# Patient Record
Sex: Female | Born: 1955 | Race: White | Hispanic: No | Marital: Married | Smoking: Never smoker
Health system: Southern US, Community
[De-identification: ages and names within clinical notes are randomized; demographics above are authoritative.]

## PROBLEM LIST (undated history)

## (undated) DIAGNOSIS — F419 Anxiety disorder, unspecified: Secondary | ICD-10-CM

## (undated) HISTORY — PX: APPENDECTOMY: SHX54

## (undated) HISTORY — PX: NOSE SURGERY: SHX723

## (undated) HISTORY — DX: Anxiety disorder, unspecified: F41.9

---

## 2013-07-08 LAB — HM COLONOSCOPY

## 2017-09-30 ENCOUNTER — Ambulatory Visit: Payer: Self-pay | Admitting: Family Medicine

## 2017-10-02 DIAGNOSIS — F419 Anxiety disorder, unspecified: Secondary | ICD-10-CM | POA: Diagnosis not present

## 2017-10-20 DIAGNOSIS — E559 Vitamin D deficiency, unspecified: Secondary | ICD-10-CM | POA: Diagnosis not present

## 2017-10-20 DIAGNOSIS — E785 Hyperlipidemia, unspecified: Secondary | ICD-10-CM | POA: Diagnosis not present

## 2017-10-20 DIAGNOSIS — F419 Anxiety disorder, unspecified: Secondary | ICD-10-CM | POA: Diagnosis not present

## 2018-03-02 ENCOUNTER — Other Ambulatory Visit: Payer: Self-pay

## 2018-03-02 ENCOUNTER — Encounter: Payer: Self-pay | Admitting: Family Medicine

## 2018-03-02 ENCOUNTER — Ambulatory Visit (INDEPENDENT_AMBULATORY_CARE_PROVIDER_SITE_OTHER): Payer: 59 | Admitting: Family Medicine

## 2018-03-02 VITALS — BP 126/84 | HR 64 | Temp 97.8°F | Resp 14 | Ht 66.0 in | Wt 157.0 lb

## 2018-03-02 DIAGNOSIS — Z0000 Encounter for general adult medical examination without abnormal findings: Secondary | ICD-10-CM | POA: Diagnosis not present

## 2018-03-02 DIAGNOSIS — R0683 Snoring: Secondary | ICD-10-CM

## 2018-03-02 DIAGNOSIS — F419 Anxiety disorder, unspecified: Secondary | ICD-10-CM | POA: Diagnosis not present

## 2018-03-02 DIAGNOSIS — G4733 Obstructive sleep apnea (adult) (pediatric): Secondary | ICD-10-CM | POA: Insufficient documentation

## 2018-03-02 NOTE — Progress Notes (Signed)
Patient ID: Theresa Matthews, female    DOB: 1956/08/21, 62 y.o.   MRN: [id number]  Chief Complaint  Patient presents with  . Establish Care    Allergies Patient has no allergy information on record.  Subjective:   Theresa Matthews is a 62 y.o. female who presents to [facility] today.  HPI Ms. Harlacher presents today as a new patient visit to establish care.  She is followed by OB/GYN in [place], [place].  She is not sure but considering continuing to see her gynecologist.  She reports that she is fairly healthy.  Does not have any major medical problems.  Does take Paxil 10 mg a day for anxiety.  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  She does not feel depressed.  Does not have any suicidal or homicidal ideations.  No side effects with this medication.  Would like to continue it.  Has had a colonoscopy several years ago and it was within normal limits.  Reports that her energy level is good.  Her mood is good.  Married enjoys being active and hiking.  Denies any chest pain, shortness of breath, swelling in her extremities.  Does use his CPAP machine for heavy snoring.  Does not have sleep disordered breathing or sleep apnea.  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  Is active.  Married with children.   Past Medical History:  Diagnosis Date  . Anxiety     Past Surgical History:  Procedure Laterality Date  . APPENDECTOMY      Family History  Problem Relation Age of Onset  . Hypertension Mother   . Hypothyroidism Mother   . Heart disease Father   . Bladder Cancer Father   . Brain cancer Brother      Social History   Socioeconomic History  . Marital status: Married    Spouse name: Not on file  . Number of children: Not on file  . Years of education: Not on file  . Highest education level: Not on file  Occupational History  . Occupation: Therapist, sports  Social Needs  . Financial resource  strain: Not hard at all  . Food insecurity:    Worry: Never true    Inability: Never true  . Transportation needs:    Medical: No    Non-medical: No  Tobacco Use  . Smoking status: Never Smoker  . Smokeless tobacco: Never Used  Substance and Sexual Activity  . Alcohol use: Not Currently  . Drug use: Never  . Sexual activity: Yes    Partners: Male  Lifestyle  . Physical activity:    Days per week: 4 days    Minutes per session: 60 min  . Stress: Only a little  Relationships  . Social connections:    Talks on phone: More than three times a week    Gets together: Three times a week    Attends religious service: More than 4 times per year    Active member of club or organization: Yes    Attends meetings of clubs or organizations: More than 4 times per year    Relationship status: Married  Other Topics Concern  . Not on file  Social History Narrative   Is a fraternal twin. Grew up in [place].    Married, married for over 30 years.   4 children.    Lives in [place], [place].   Eats all food  groups.   Wear seatbelt.   Exercise, hike trails, ride bikes.    Review of Systems  Constitutional: Negative for activity change, appetite change and fever.  HENT: Negative for dental problem, ear pain, hearing loss, mouth sores, postnasal drip, tinnitus, trouble swallowing and voice change.   Eyes: Negative for visual disturbance.  Respiratory: Negative for cough, chest tightness, shortness of breath, wheezing and stridor.   Cardiovascular: Negative for chest pain, palpitations and leg swelling.  Gastrointestinal: Negative for abdominal pain, anal bleeding, constipation, nausea and vomiting.  Genitourinary: Negative for dyspareunia, dysuria, flank pain, frequency, menstrual problem, pelvic pain, urgency and vaginal pain.  Musculoskeletal: Negative for arthralgias, back pain, gait problem, joint swelling and neck stiffness.  Skin: Negative for rash.  Neurological: Negative for  dizziness, tremors, seizures, syncope, weakness, light-headedness and headaches.  Hematological: Negative for adenopathy. Does not bruise/bleed easily.  Psychiatric/Behavioral: Negative for behavioral problems, decreased concentration, dysphoric mood, self-injury, sleep disturbance and suicidal ideas. The patient is not nervous/anxious.     Current Outpatient Medications on File Prior to Visit  Medication Sig Dispense Refill  . PARoxetine (PAXIL) 10 MG tablet Take 10 mg by mouth daily.  1   No current facility-administered medications on file prior to visit.     Objective:   BP 126/84 (BP Location: Left Arm, Patient Position: Sitting, Cuff Size: Normal)   Pulse 64   Temp 97.8 F (36.6 C) (Oral)   Resp 14   Ht 5\' 6"  (1.676 m)   Wt 157 lb (71.2 kg)   SpO2 98%   BMI 25.34 kg/m   Physical Exam  Constitutional: She is oriented to person, place, and time. She appears well-developed and well-nourished. No distress.  HENT:  Head: Normocephalic and atraumatic.  Right Ear: External ear normal.  Left Ear: External ear normal.  Nose: Nose normal.  Mouth/Throat: Oropharynx is clear and moist. No oropharyngeal exudate.  Eyes: Pupils are equal, round, and reactive to light. Conjunctivae and EOM are normal. No scleral icterus.  Neck: Normal range of motion. Neck supple. No JVD present. No tracheal deviation present. No thyromegaly present.  Cardiovascular: Normal rate, regular rhythm, normal heart sounds and intact distal pulses.  No murmur heard. Pulmonary/Chest: Effort normal and breath sounds normal. No stridor. No respiratory distress. She has no wheezes. She has no rales. She exhibits no tenderness.  Abdominal: Soft. Bowel sounds are normal. She exhibits no distension and no mass. There is no tenderness. There is no rebound. No hernia.  Musculoskeletal: Normal range of motion.  Lymphadenopathy:    She has no cervical adenopathy.  Neurological: She is alert and oriented to person,  place, and time. She displays normal reflexes. No cranial nerve deficit or sensory deficit. She exhibits normal muscle tone. Coordination normal.  Skin: Skin is warm and dry. Capillary refill takes less than 2 seconds. No rash noted.  Psychiatric: She has a normal mood and affect. Her behavior is normal. Judgment and thought content normal.  Nursing note and vitals reviewed.    Assessment and Plan  1. Well adult exam Age appropriate anticipatory guidance and discussed with patient.   Labs ordered.  - HIV antibody - Hepatitis C antibody - MM Digital Screening; Future - Lipid panel - CBC with Differential/Platelet - COMPLETE METABOLIC PANEL WITH GFR Vision  Dentist Colonoscopy UTD. Bone density, age 49 years. Follow up with gynecology as directed. Need records from Employee Health/immunization/labs for immunity to Hepatitis B Request records for colonoscopy from [facility]  Did discuss with  patient that her BMI is borderline being overweight.  Diet, exercise and healthy lifestyle modifications recommended.  We will request records to make sure that she has had immunity status testing for hepatitis B did her job risks. Did recommend that she either follow-up for her schedule with gynecology for Pap, pelvic, and clinical breast exam. Calcium 600 mg twice a day Vitamin D 2000 IU qd.  2. Anxiety Stable. Patient counseled in detail regarding the risks of medication. Told to call or return to clinic if develop any worrisome signs or symptoms. Patient voiced understanding.  Call with concerns. Refill when needed.  Suicide risks evaluated and documented in note if present or in the area below.  Patient understands the treatment plan and is in agreement. Agrees to keep follow up and call prior or return to clinic if needed.    3. Snoring, using CPAP Continue current plan. Recommend repeat NPSNG in 3-4 years.    Return in about 6 months (around 09/02/2018), or if symptoms worsen or  fail to improve. Caren Macadam, MD 03/02/2018

## 2018-03-02 NOTE — Patient Instructions (Addendum)
Need records from Employee Health/immunization/labs for immunity to Hepatitis B Request records for colonoscopy from [facility]  Calcium 600 mg twice a day Vitamin D 2000 IU a day

## 2018-03-03 LAB — CBC WITH DIFFERENTIAL/PLATELET
BASOS PCT: 1 %
Basophils Absolute: 48 cells/uL (ref 0–200)
Eosinophils Absolute: 67 cells/uL (ref 15–500)
Eosinophils Relative: 1.4 %
HCT: 40.6 % (ref 35.0–45.0)
Hemoglobin: 13.8 g/dL (ref 11.7–15.5)
Lymphs Abs: 802 cells/uL — ABNORMAL LOW (ref 850–3900)
MCH: 30.5 pg (ref 27.0–33.0)
MCHC: 34 g/dL (ref 32.0–36.0)
MCV: 89.6 fL (ref 80.0–100.0)
MONOS PCT: 9.1 %
MPV: 10.3 fL (ref 7.5–12.5)
NEUTROS ABS: 3446 {cells}/uL (ref 1500–7800)
Neutrophils Relative %: 71.8 %
PLATELETS: 258 10*3/uL (ref 140–400)
RBC: 4.53 10*6/uL (ref 3.80–5.10)
RDW: 12.1 % (ref 11.0–15.0)
Total Lymphocyte: 16.7 %
WBC mixed population: 437 cells/uL (ref 200–950)
WBC: 4.8 10*3/uL (ref 3.8–10.8)

## 2018-03-03 LAB — COMPLETE METABOLIC PANEL WITH GFR
AG Ratio: 1.8 (calc) (ref 1.0–2.5)
ALT: 12 U/L (ref 6–29)
AST: 18 U/L (ref 10–35)
Albumin: 4.2 g/dL (ref 3.6–5.1)
Alkaline phosphatase (APISO): 54 U/L (ref 33–130)
BILIRUBIN TOTAL: 0.6 mg/dL (ref 0.2–1.2)
BUN: 13 mg/dL (ref 7–25)
CALCIUM: 9.1 mg/dL (ref 8.6–10.4)
CHLORIDE: 106 mmol/L (ref 98–110)
CO2: 29 mmol/L (ref 20–32)
Creat: 0.8 mg/dL (ref 0.50–0.99)
GFR, Est African American: 91 mL/min/{1.73_m2} (ref >=60)
GFR, Est Non African American: 78 mL/min/{1.73_m2} (ref >=60)
GLUCOSE: 88 mg/dL (ref 65–99)
Globulin: 2.4 g/dL (calc) (ref 1.9–3.7)
Potassium: 3.7 mmol/L (ref 3.5–5.3)
Sodium: 141 mmol/L (ref 135–146)
TOTAL PROTEIN: 6.6 g/dL (ref 6.1–8.1)

## 2018-03-03 LAB — LIPID PANEL
CHOL/HDL RATIO: 3 (calc) (ref <5.0)
CHOLESTEROL: 225 mg/dL — AB (ref <200)
HDL: 74 mg/dL (ref >50)
LDL CHOLESTEROL (CALC): 136 mg/dL — AB
Non-HDL Cholesterol (Calc): 151 mg/dL (calc) — ABNORMAL HIGH (ref <130)
Triglycerides: 49 mg/dL (ref <150)

## 2018-03-03 LAB — HIV ANTIBODY (ROUTINE TESTING W REFLEX): HIV 1&2 Ab, 4th Generation: NONREACTIVE

## 2018-03-03 LAB — HEPATITIS C ANTIBODY
Hepatitis C Ab: NONREACTIVE
SIGNAL TO CUT-OFF: 0.01 (ref <1.00)

## 2018-03-04 ENCOUNTER — Encounter: Payer: Self-pay | Admitting: Family Medicine

## 2018-03-26 ENCOUNTER — Encounter: Payer: Self-pay | Admitting: Family Medicine

## 2018-03-27 ENCOUNTER — Encounter: Payer: Self-pay | Admitting: Family Medicine

## 2018-04-30 ENCOUNTER — Telehealth: Payer: Self-pay | Admitting: Family Medicine

## 2018-04-30 ENCOUNTER — Other Ambulatory Visit: Payer: Self-pay

## 2018-04-30 MED ORDER — PAROXETINE HCL 10 MG PO TABS: 10.0000 mg | ORAL_TABLET | Freq: Every day | ORAL | 0 refills | Status: DC

## 2018-04-30 NOTE — Telephone Encounter (Signed)
Patient left message on phone to request the following medication.   PARoxetine (PAXIL) 10 MG tablet  Pharmacy is [facility] in [place], [place]. CB# [phone]

## 2018-04-30 NOTE — Telephone Encounter (Signed)
Paxil refilled with #90 per Dr.Hagler

## 2018-04-30 NOTE — Telephone Encounter (Signed)
Please check patient's med list and confirm dosing, please give her a refill for #90.

## 2018-06-02 ENCOUNTER — Telehealth: Payer: Self-pay | Admitting: Family Medicine

## 2018-06-02 NOTE — Telephone Encounter (Signed)
Spoke with patient and she is wanting to know if she can have enough refills on her Paxil to last through the rest of the year.

## 2018-06-02 NOTE — Telephone Encounter (Signed)
Pt is calling wanting to know if she can get a refill to last thru the year.

## 2018-06-03 ENCOUNTER — Other Ambulatory Visit: Payer: Self-pay

## 2018-06-03 MED ORDER — PAROXETINE HCL 10 MG PO TABS: 10.0000 mg | ORAL_TABLET | Freq: Every day | ORAL | 1 refills | Status: DC

## 2018-06-03 NOTE — Telephone Encounter (Signed)
Yes. Refill paxil 10 mg, 1 po qd #90, 1 refill. Gwen Her. Mannie Stabile, MD

## 2018-06-03 NOTE — Telephone Encounter (Signed)
Medication refilled per Dr.Hagler's instructions

## 2018-06-05 DIAGNOSIS — D485 Neoplasm of uncertain behavior of skin: Secondary | ICD-10-CM | POA: Diagnosis not present

## 2018-06-05 DIAGNOSIS — L298 Other pruritus: Secondary | ICD-10-CM | POA: Diagnosis not present

## 2018-06-05 DIAGNOSIS — L570 Actinic keratosis: Secondary | ICD-10-CM | POA: Diagnosis not present

## 2018-06-05 DIAGNOSIS — Z789 Other specified health status: Secondary | ICD-10-CM | POA: Diagnosis not present

## 2018-06-05 DIAGNOSIS — L820 Inflamed seborrheic keratosis: Secondary | ICD-10-CM | POA: Diagnosis not present

## 2018-06-05 DIAGNOSIS — L538 Other specified erythematous conditions: Secondary | ICD-10-CM | POA: Diagnosis not present

## 2018-07-09 DIAGNOSIS — F410 Panic disorder [episodic paroxysmal anxiety] without agoraphobia: Secondary | ICD-10-CM | POA: Diagnosis not present

## 2018-07-28 DIAGNOSIS — F410 Panic disorder [episodic paroxysmal anxiety] without agoraphobia: Secondary | ICD-10-CM | POA: Diagnosis not present

## 2018-09-21 DIAGNOSIS — Z760 Encounter for issue of repeat prescription: Secondary | ICD-10-CM | POA: Diagnosis not present

## 2018-09-21 DIAGNOSIS — F419 Anxiety disorder, unspecified: Secondary | ICD-10-CM | POA: Diagnosis not present

## 2019-01-12 DIAGNOSIS — Z0000 Encounter for general adult medical examination without abnormal findings: Secondary | ICD-10-CM | POA: Diagnosis not present

## 2019-01-12 DIAGNOSIS — F419 Anxiety disorder, unspecified: Secondary | ICD-10-CM | POA: Diagnosis not present

## 2019-01-12 DIAGNOSIS — E559 Vitamin D deficiency, unspecified: Secondary | ICD-10-CM | POA: Diagnosis not present

## 2019-01-12 DIAGNOSIS — E785 Hyperlipidemia, unspecified: Secondary | ICD-10-CM | POA: Diagnosis not present

## 2019-09-30 DIAGNOSIS — H43813 Vitreous degeneration, bilateral: Secondary | ICD-10-CM | POA: Diagnosis not present

## 2019-09-30 DIAGNOSIS — H40013 Open angle with borderline findings, low risk, bilateral: Secondary | ICD-10-CM | POA: Diagnosis not present

## 2019-09-30 DIAGNOSIS — H04123 Dry eye syndrome of bilateral lacrimal glands: Secondary | ICD-10-CM | POA: Diagnosis not present

## 2019-09-30 DIAGNOSIS — H25813 Combined forms of age-related cataract, bilateral: Secondary | ICD-10-CM | POA: Diagnosis not present

## 2019-10-08 DIAGNOSIS — Z20828 Contact with and (suspected) exposure to other viral communicable diseases: Secondary | ICD-10-CM | POA: Diagnosis not present

## 2019-10-11 DIAGNOSIS — Z03818 Encounter for observation for suspected exposure to other biological agents ruled out: Secondary | ICD-10-CM | POA: Diagnosis not present

## 2019-10-16 DIAGNOSIS — Z20828 Contact with and (suspected) exposure to other viral communicable diseases: Secondary | ICD-10-CM | POA: Diagnosis not present

## 2019-11-08 DIAGNOSIS — Z1231 Encounter for screening mammogram for malignant neoplasm of breast: Secondary | ICD-10-CM | POA: Diagnosis not present

## 2019-11-08 DIAGNOSIS — Z1212 Encounter for screening for malignant neoplasm of rectum: Secondary | ICD-10-CM | POA: Diagnosis not present

## 2019-11-08 DIAGNOSIS — Z01419 Encounter for gynecological examination (general) (routine) without abnormal findings: Secondary | ICD-10-CM | POA: Diagnosis not present

## 2020-01-13 DIAGNOSIS — E559 Vitamin D deficiency, unspecified: Secondary | ICD-10-CM | POA: Diagnosis not present

## 2020-01-13 DIAGNOSIS — Z0000 Encounter for general adult medical examination without abnormal findings: Secondary | ICD-10-CM | POA: Diagnosis not present

## 2020-01-13 DIAGNOSIS — E785 Hyperlipidemia, unspecified: Secondary | ICD-10-CM | POA: Diagnosis not present

## 2020-01-13 DIAGNOSIS — I8391 Asymptomatic varicose veins of right lower extremity: Secondary | ICD-10-CM | POA: Diagnosis not present

## 2020-04-20 ENCOUNTER — Ambulatory Visit: Payer: 59

## 2020-04-20 ENCOUNTER — Other Ambulatory Visit: Payer: Self-pay

## 2020-04-20 ENCOUNTER — Ambulatory Visit: Payer: 59 | Admitting: Orthopaedic Surgery

## 2020-04-20 ENCOUNTER — Encounter: Payer: Self-pay | Admitting: Orthopaedic Surgery

## 2020-04-20 VITALS — BP 128/83 | HR 61 | Ht 66.0 in | Wt 149.0 lb

## 2020-04-20 DIAGNOSIS — M79662 Pain in left lower leg: Secondary | ICD-10-CM

## 2020-04-20 NOTE — Progress Notes (Signed)
Subjective:    Patient ID: Theresa Matthews, female    DOB: July 05, 1956, 64 y.o.   MRN: [id number]  HPI She was helping her husband at their place in [place] and cutting logs and trees on the hill side several days ago.  She fell and twisted and hurt her lower left leg.  She has pain at times now and then.  Other times it does not hurt.  It awakened her with marked pain last night. She called for appointment and we had her come in.  She has no redness, no swelling.  She can walk with tenderness. She has no swelling of the left ankle, no numbness.   Review of Systems  Constitutional: Positive for activity change.  Musculoskeletal: Positive for gait problem.  All other systems reviewed and are negative.  For Review of Systems, all other systems reviewed and are negative.  The following is a summary of the past history medically, past history surgically, known current medicines, social history and family history.  This information is gathered electronically by the computer from prior information and documentation.  I review this each visit and have found including this information at this point in the chart is beneficial and informative.   Past Medical History:  Diagnosis Date   Anxiety     Past Surgical History:  Procedure Laterality Date   APPENDECTOMY      Current Outpatient Medications on File Prior to Visit  Medication Sig Dispense Refill   PARoxetine (PAXIL) 10 MG tablet Take 1 tablet (10 mg total) by mouth daily. 90 tablet 1   No current facility-administered medications on file prior to visit.    Social History   Socioeconomic History   Marital status: Married    Spouse name: Not on file   Number of children: Not on file   Years of education: Not on file   Highest education level: Not on file  Occupational History   Occupation: RN  Tobacco Use   Smoking status: Never Smoker   Smokeless tobacco: Never Used  Scientific laboratory technician Use: Never used    Substance and Sexual Activity   Alcohol use: Not Currently   Drug use: Never   Sexual activity: Yes    Partners: Male  Other Topics Concern   Not on file  Social History Narrative   Is a fraternal twin. Grew up in [place].    Married, married for over 63 years.   4 children.    Lives in [place], [place].   Eats all food groups.   Wear seatbelt.   Exercise, hike trails, ride bikes.   Social Determinants of Health   Financial Resource Strain:    Difficulty of Paying Living Expenses:   Food Insecurity:    Worried About Charity fundraiser in the Last Year:    Arboriculturist in the Last Year:   Transportation Needs:    Film/video editor (Medical):    Lack of Transportation (Non-Medical):   Physical Activity:    Days of Exercise per Week:    Minutes of Exercise per Session:   Stress:    Feeling of Stress :   Social Connections:    Frequency of Communication with Friends and Family:    Frequency of Social Gatherings with Friends and Family:    Attends Religious Services:    Active Member of Clubs or Organizations:    Attends Archivist Meetings:    Marital Status:   Intimate  Partner Violence:    Fear of Current or Ex-Partner:    Emotionally Abused:    Physically Abused:    Sexually Abused:     Family History  Problem Relation Age of Onset   Hypertension Mother    Hypothyroidism Mother    Heart disease Father    Bladder Cancer Father    Brain cancer Brother     BP 128/83    Pulse 61    Ht 5\' 6"  (1.676 m)    Wt 149 lb (67.6 kg)    BMI 24.05 kg/m   Body mass index is 24.05 kg/m.     Objective:   Physical Exam Vitals and nursing note reviewed.  Constitutional:      Appearance: She is well-developed.  HENT:     Head: Normocephalic and atraumatic.  Eyes:     Conjunctiva/sclera: Conjunctivae normal.     Pupils: Pupils are equal, round, and reactive to light.  Cardiovascular:     Rate and Rhythm: Normal rate and  regular rhythm.  Pulmonary:     Effort: Pulmonary effort is normal.  Abdominal:     Palpations: Abdomen is soft.  Musculoskeletal:     Cervical back: Normal range of motion and neck supple.       Legs:  Skin:    General: Skin is warm and dry.  Neurological:     Mental Status: She is alert and oriented to person, place, and time.     Cranial Nerves: No cranial nerve deficit.     Motor: No abnormal muscle tone.     Coordination: Coordination normal.     Deep Tendon Reflexes: Reflexes are normal and symmetric. Reflexes normal.  Psychiatric:        Behavior: Behavior normal.        Thought Content: Thought content normal.        Judgment: Judgment normal.    X-rays were done of the left lower leg, reported separately.       Assessment & Plan:   Encounter Diagnosis  Name Primary?   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  She can work if she chooses.  I will give CAM walker.   Use ice, Aspercreme, biofreeze or Voltaren Gel.  Return in two weeks.  Call if any problem.  Precautions discussed.   Electronically Signed Sanjuana Kava, MD 7/1/202111:03 AM

## 2020-05-02 ENCOUNTER — Ambulatory Visit: Payer: 59 | Admitting: Orthopaedic Surgery

## 2020-08-24 DIAGNOSIS — H5203 Hypermetropia, bilateral: Secondary | ICD-10-CM | POA: Diagnosis not present

## 2020-08-24 DIAGNOSIS — H2513 Age-related nuclear cataract, bilateral: Secondary | ICD-10-CM | POA: Diagnosis not present

## 2020-08-24 DIAGNOSIS — H43811 Vitreous degeneration, right eye: Secondary | ICD-10-CM | POA: Diagnosis not present

## 2020-08-24 DIAGNOSIS — H40013 Open angle with borderline findings, low risk, bilateral: Secondary | ICD-10-CM | POA: Diagnosis not present

## 2020-12-10 ENCOUNTER — Emergency Department (HOSPITAL_COMMUNITY): Payer: Medicare Other

## 2020-12-10 ENCOUNTER — Observation Stay (HOSPITAL_COMMUNITY)
Admission: EM | Admit: 2020-12-10 | Discharge: 2020-12-11 | Disposition: A | Discharge status: Home / Self Care | Payer: Medicare Other | Attending: Internal Medicine | Admitting: Internal Medicine

## 2020-12-10 ENCOUNTER — Other Ambulatory Visit: Payer: Self-pay

## 2020-12-10 ENCOUNTER — Encounter (HOSPITAL_COMMUNITY): Payer: Self-pay | Admitting: Emergency Medicine

## 2020-12-10 DIAGNOSIS — E785 Hyperlipidemia, unspecified: Secondary | ICD-10-CM | POA: Diagnosis present

## 2020-12-10 DIAGNOSIS — Z79899 Other long term (current) drug therapy: Secondary | ICD-10-CM | POA: Insufficient documentation

## 2020-12-10 DIAGNOSIS — R030 Elevated blood-pressure reading, without diagnosis of hypertension: Secondary | ICD-10-CM | POA: Diagnosis present

## 2020-12-10 DIAGNOSIS — Z20822 Contact with and (suspected) exposure to covid-19: Secondary | ICD-10-CM | POA: Diagnosis not present

## 2020-12-10 DIAGNOSIS — R079 Chest pain, unspecified: Principal | ICD-10-CM | POA: Insufficient documentation

## 2020-12-10 DIAGNOSIS — R918 Other nonspecific abnormal finding of lung field: Secondary | ICD-10-CM | POA: Diagnosis not present

## 2020-12-10 DIAGNOSIS — F419 Anxiety disorder, unspecified: Secondary | ICD-10-CM | POA: Diagnosis present

## 2020-12-10 DIAGNOSIS — I10 Essential (primary) hypertension: Secondary | ICD-10-CM | POA: Diagnosis not present

## 2020-12-10 HISTORY — DX: Chest pain, unspecified: R07.9

## 2020-12-10 LAB — HEPATIC FUNCTION PANEL
ALT: 15 U/L (ref 0–44)
AST: 18 U/L (ref 15–41)
Albumin: 4 g/dL (ref 3.5–5.0)
Alkaline Phosphatase: 45 U/L (ref 38–126)
Bilirubin, Direct: <0.1 mg/dL (ref 0.0–0.2)
Total Bilirubin: 0.5 mg/dL (ref 0.3–1.2)
Total Protein: 6.6 g/dL (ref 6.5–8.1)

## 2020-12-10 LAB — BASIC METABOLIC PANEL
Anion gap: 7 (ref 5–15)
BUN: 11 mg/dL (ref 8–23)
CO2: 29 mmol/L (ref 22–32)
Calcium: 9.5 mg/dL (ref 8.9–10.3)
Chloride: 104 mmol/L (ref 98–111)
Creatinine, Ser: 0.73 mg/dL (ref 0.44–1.00)
GFR, Estimated: >60 mL/min (ref >60)
Glucose, Bld: 99 mg/dL (ref 70–99)
Potassium: 3.9 mmol/L (ref 3.5–5.1)
Sodium: 140 mmol/L (ref 135–145)

## 2020-12-10 LAB — CBC
HCT: 46.5 % — ABNORMAL HIGH (ref 36.0–46.0)
Hemoglobin: 15.5 g/dL — ABNORMAL HIGH (ref 12.0–15.0)
MCH: 31.7 pg (ref 26.0–34.0)
MCHC: 33.3 g/dL (ref 30.0–36.0)
MCV: 95.1 fL (ref 80.0–100.0)
Platelets: 288 10*3/uL (ref 150–400)
RBC: 4.89 MIL/uL (ref 3.87–5.11)
RDW: 12.4 % (ref 11.5–15.5)
WBC: 5.2 10*3/uL (ref 4.0–10.5)
nRBC: 0 % (ref 0.0–0.2)

## 2020-12-10 LAB — RESP PANEL BY RT-PCR (FLU A&B, COVID) ARPGX2
Influenza A by PCR: NEGATIVE
Influenza B by PCR: NEGATIVE
SARS Coronavirus 2 by RT PCR: NEGATIVE

## 2020-12-10 LAB — TROPONIN I (HIGH SENSITIVITY)
Troponin I (High Sensitivity): 4 ng/L (ref <18)
Troponin I (High Sensitivity): 4 ng/L (ref <18)

## 2020-12-10 MED ORDER — ENOXAPARIN SODIUM 40 MG/0.4ML ~~LOC~~ SOLN: 40.0000 mg | SUBCUTANEOUS | Status: DC

## 2020-12-10 MED ORDER — ASPIRIN EC 81 MG PO TBEC
81.0000 mg | DELAYED_RELEASE_TABLET | Freq: Every day | ORAL | Status: DC
  Administered 2020-12-11: 81 mg via ORAL
  Filled 2020-12-10: qty 1

## 2020-12-10 MED ORDER — ACETAMINOPHEN 325 MG PO TABS: 650.0000 mg | ORAL_TABLET | Freq: Four times a day (QID) | ORAL | Status: DC | PRN

## 2020-12-10 MED ORDER — ACETAMINOPHEN 650 MG RE SUPP: 650.0000 mg | Freq: Four times a day (QID) | RECTAL | Status: DC | PRN

## 2020-12-10 MED ORDER — IOHEXOL 300 MG/ML  SOLN
75.0000 mL | Freq: Once | INTRAMUSCULAR | Status: AC | PRN
  Administered 2020-12-10: 75 mL via INTRAVENOUS

## 2020-12-10 MED ORDER — SODIUM CHLORIDE 0.9 % IV SOLN: 75.0000 mL/h | INTRAVENOUS | Status: AC

## 2020-12-10 MED ORDER — HYDROCODONE-ACETAMINOPHEN 5-325 MG PO TABS: 1.0000 | ORAL_TABLET | ORAL | Status: DC | PRN

## 2020-12-10 MED ORDER — PAROXETINE HCL 10 MG PO TABS
10.0000 mg | ORAL_TABLET | Freq: Every day | ORAL | Status: DC
  Administered 2020-12-10: 10 mg via ORAL
  Filled 2020-12-10 (×3): qty 1

## 2020-12-10 NOTE — H&P (Signed)
Poet Hineman URK:[id number] DOB: 1956/07/29 DOA: 12/10/2020    PCP: Dairl Ponder, MD   Outpatient Specialists:   NONE    Patient arrived to ER on 12/10/20 at 1028 Referred by Attending Davonna Belling, MD   Patient coming from: home Lives   With family    Chief Complaint:  Chief Complaint  Patient presents with  . Chest Pain    HPI: Theresa Matthews is a 65 y.o. female with medical history significant of HLD, anxiety    Presented with   Squeezing chest pain started with Am while going to church at 9:17 AM Radiated to jaw bilateral lasted 20 min  No SOB, no N/V Patient is active at baseline, takes stairs when walks with no exertional CP She have had 5 episodes like that before Not associated with eating No pleuritic pain Of note Patient daughter is getting married in 2 days  Father had CABG in 70's  No tobacco or EtOh   Infectious risk factors:  Reports  chest pain,   Has   been vaccinated against COVID no booster   Initial COVID TEST  NEGATIVE   Lab Results  Component Value Date   [place] NEGATIVE 12/10/2020    Regarding pertinent Chronic problems:     Hyperlipidemia -  Not on statins Lipid Panel     Component Value Date/Time   CHOL 225 (H) 03/02/2018 0927   TRIG 49 03/02/2018 0927   HDL 74 03/02/2018 0927   CHOLHDL 3.0 03/02/2018 0927   LDLCALC 136 (H) 03/02/2018 0927    While in ER: Noted ECG with non specific changes, now pain free Trop neg x2 CXr showed - hilar density CTA negative   Hospitalist was called for admission for chest pain   The following Work up has been ordered so far:  Orders Placed This Encounter  Procedures  . Resp Panel by RT-PCR (Flu A&B, Covid) Nasopharyngeal Swab  . DG Chest 2 View  . CT Chest W Contrast  . Basic metabolic panel  . CBC  . Document Height and Actual Weight  . Consult to hospitalist  ALL PATIENTS BEING ADMITTED/HAVING PROCEDURES NEED COVID-19 SCREENING  . ED EKG  . EKG 12-Lead    Following Medications were ordered in ER: Medications - No data to display      Consult Orders  (From admission, onward)         Start     Ordered   12/10/20 1740  Consult to hospitalist  ALL PATIENTS BEING ADMITTED/HAVING PROCEDURES NEED COVID-19 SCREENING Pg by Remo Lipps  Once       Comments: ALL PATIENTS BEING ADMITTED/HAVING PROCEDURES NEED COVID-19 SCREENING  Provider:  (Not yet assigned)  Question Answer Comment  Place call to: [facility]   Reason for Consult Admit      12/10/20 1739          Significant initial  Findings: Abnormal Labs Reviewed  CBC - Abnormal; Notable for the following components:      Result Value   Hemoglobin 15.5 (*)    HCT 46.5 (*)    All other components within normal limits    Otherwise labs showing:    Recent Labs  Lab 12/10/20 1058  NA 140  K 3.9  CO2 29  GLUCOSE 99  BUN 11  CREATININE 0.73  CALCIUM 9.5   Cr   stable,    Lab Results  Component Value Date   CREATININE 0.73 12/10/2020   CREATININE 0.80 03/02/2018  Recent Labs  Lab 12/10/20 1851  AST 18  ALT 15  ALKPHOS 45  BILITOT 0.5  PROT 6.6  ALBUMIN 4.0   Lab Results  Component Value Date   CALCIUM 9.5 12/10/2020      WBC      Component Value Date/Time   WBC 5.2 12/10/2020 1058   LYMPHSABS 802 (L) 03/02/2018 0927   EOSABS 67 03/02/2018 0927   BASOSABS 48 03/02/2018 0927    Plt: Lab Results  Component Value Date   PLT 288 12/10/2020     COVID-19 Labs  No results for input(s): DDIMER, FERRITIN, LDH, CRP in the last 72 hours.  Lab Results  Component Value Date   SARSCOV2NAA NEGATIVE 12/10/2020      HG/HCT     Component Value Date/Time   HGB 15.5 (H) 12/10/2020 1058   HCT 46.5 (H) 12/10/2020 1058   MCV 95.1 12/10/2020 1058    Troponin 4-4 Cardiac Panel (last 3 results) No results for input(s): CKTOTAL, CKMB, TROPONINI, RELINDX in the last 72 hours.     ECG: Ordered Personally reviewed by me showing: HR : 63 Rhythm:  NSR,     nonspecific changes,   QTC 403     UA  not ordered      Ordered  CXR -Hilar prominence, notably on the LATERAL view     CTA chest - nonacute, no PE,   no evidence of infiltrate     ED Triage Vitals  Enc Vitals Group     BP 12/10/20 1044 (!) 166/77     Pulse Rate 12/10/20 1044 67     Resp 12/10/20 1044 18     Temp 12/10/20 1044 97.6 F (36.4 C)     Temp Source 12/10/20 1044 Oral     SpO2 12/10/20 1044 100 %     Weight --      Height --      Head Circumference --      Peak Flow --      Pain Score 12/10/20 1050 0     Pain Loc --      Pain Edu? --      Excl. in [place]? --   TMAX(24)@       Latest  Blood pressure (!) 178/73, pulse 63, temperature 98.6 F (37 C), temperature source Oral, resp. rate 16, SpO2 100 %.    Review of Systems:    Pertinent positives include:    chest pain  Constitutional:  No weight loss, night sweats, Fevers, chills, fatigue, weight loss  HEENT:  No headaches, Difficulty swallowing,Tooth/dental problems,Sore throat,  No sneezing, itching, ear ache, nasal congestion, post nasal drip,  Cardio-vascular:  No, Orthopnea, PND, anasarca, dizziness, palpitations.no Bilateral lower extremity swelling  GI:  No heartburn, indigestion, abdominal pain, nausea, vomiting, diarrhea, change in bowel habits, loss of appetite, melena, blood in stool, hematemesis Resp:  no shortness of breath at rest. No dyspnea on exertion, No excess mucus, no productive cough, No non-productive cough, No coughing up of blood.No change in color of mucus.No wheezing. Skin:  no rash or lesions. No jaundice GU:  no dysuria, change in color of urine, no urgency or frequency. No straining to urinate.  No flank pain.  Musculoskeletal:  No joint pain or no joint swelling. No decreased range of motion. No back pain.  Psych:  No change in mood or affect. No depression or anxiety. No memory loss.  Neuro: no localizing neurological complaints, no tingling, no weakness, no double  vision,  no gait abnormality, no slurred speech, no confusion  All systems reviewed and apart from [place] all are negative  Past Medical History:   Past Medical History:  Diagnosis Date  . Anxiety      Past Surgical History:  Procedure Laterality Date  . APPENDECTOMY      Social History:  Ambulatory  independently       reports that she has never smoked. She has never used smokeless tobacco. She reports previous alcohol use. She reports that she does not use drugs.    Family History:   Family History  Problem Relation Age of Onset  . Hypertension Mother   . Hypothyroidism Mother   . Heart disease Father   . Bladder Cancer Father   . Brain cancer Brother     Allergies: No Known Allergies   Prior to Admission medications   Medication Sig Start Date End Date Taking? Authorizing Provider  PARoxetine (PAXIL) 10 MG tablet Take 1 tablet (10 mg total) by mouth daily. Patient taking differently: Take 10 mg by mouth at bedtime. 06/03/18  Yes Caren Macadam, MD   Physical Exam: Vitals with BMI 12/10/2020 12/10/2020 12/10/2020  Height - - -  Weight - - -  BMI - - -  Systolic 163 846 659  Diastolic 73 78 74  Pulse 63 69 67     1. General:  in No  Acute distress    Chronically ill -appearing 2. Psychological: Alert and Oriented 3. Head/ENT:     Dry Mucous Membranes                          Head Non traumatic, neck supple                          Normal  Dentition 4. SKIN: normal   Skin turgor,  Skin clean Dry and intact no rash 5. Heart: Regular rate and rhythm no Murmur, no Rub or gallop 6. Lungs: no wheezes or crackles   7. Abdomen: Soft,   non-tender, Non distended bowel sounds present 8. Lower extremities: no clubbing, cyanosis, no edema 9. Neurologically Grossly intact, moving all 4 extremities equally   10. MSK: Normal range of motion   All other LABS:     Recent Labs  Lab 12/10/20 1058  WBC 5.2  HGB 15.5*  HCT 46.5*  MCV 95.1  PLT 288     Recent  Labs  Lab 12/10/20 1058  NA 140  K 3.9  CL 104  CO2 29  GLUCOSE 99  BUN 11  CREATININE 0.73  CALCIUM 9.5     No results for input(s): AST, ALT, ALKPHOS, BILITOT, PROT, ALBUMIN in the last 168 hours.     Cultures: No results found for: SDES, SPECREQUEST, CULT, REPTSTATUS   Radiological Exams on Admission: DG Chest 2 View  Result Date: 12/10/2020 CLINICAL DATA:  65 year old female with chest pain. EXAM: CHEST - 2 VIEW COMPARISON:  None FINDINGS: Hilar prominence is noted, most notably the RIGHT hilar region on the LATERAL view The cardiomediastinal silhouette is otherwise unremarkable. There is no evidence of focal airspace disease, pulmonary edema, suspicious pulmonary nodule/mass, pleural effusion, or pneumothorax. No acute bony abnormalities are identified. IMPRESSION: 1. Hilar prominence, notably on the LATERAL view. If no outside prior studies are available for comparison to assess stability, chest CT with contrast is recommended. Electronically Signed   By: Margarette Canada M.D.   On:  12/10/2020 11:39    Chart has been reviewed   Assessment/Plan   65 y.o. female with medical history significant of HLD, anxiety    Admitted for chest pain,   Present on Admission: . Chest pain -  H=  2 ,E=  1  ,A=  2  , R  1  , T 0   for the  Total of 6  therefore will admit for observation and further evaluation ( Risk of MACE: Scores 0-3  of 0.9-1.7%.,  4-6: 12-16.6% , Scores ?7: 50-65% ) - troponin unremarkable  -No acute ischemic changes on ECG    - monitor on telemetry, cycle cardiac enzymes, obtain serial ECG and  ECHO in AM.   - Daily aspirin -  Further risk stratify with lipid panel, hgA1C, obtain TSH.  Make sure patient is on Aspirin.  We will notify cardiology regarding patient's admission. Further management depends on pending  workup  . Anxiety -continue paxil  . Hyperlipidemia - not on Statin check lipid panel in AM  . Elevated BP without diagnosis of hypertension -could be  an anxiety induced while patient is in the emergency department continue to follow will need to reassess as an outpatient   Other plan as per orders.  DVT prophylaxis:  Lovenox       Code Status:    Code Status: Not on file FULL CODE   as per patient   I had personally discussed CODE STATUS with patient     Family Communication:   Family   at  Bedside discussed with husband    Disposition Plan:     To home once workup is complete and patient is stable   Following barriers for discharge:                          Cardiac work-up completed    Dola called   Emailed cardiology patient being admitted. If echogram abnormal will need inpatient consult otherwise.  Would benefit from outpatient follow-up   Admission status:  ED Disposition    ED Disposition Condition Jeann[place]: [place] [[id number]]  Level of Care: Telemetry Cardiac [[id number]]  I expect the patient will be discharged within 24 hours: No (not a candidate for 5C-Observation unit)  Covid Evaluation: Symptomatic Person Under Investigation (PUI)  Diagnosis: Chest pain [[id number]]  Admitting Physician: Toy Baker [[id number]]  Attending Physician: Toy Baker [[id number]]       Obs    Level of care  tele  For  24H    Lab Results  Component Value Date   [place] 12/10/2020    Precautions: admitted as  Covid Negative      PPE: Used by the provider:   N95  eye Goggles,  Gloves     Toy Baker 12/10/2020, 8:33 PM    [facility]     after 2 AM please page floor coverage PA If 7AM-7PM, please contact the day team taking care of the patient using [email or web address]   Patient was evaluated in the context of the global COVID-19 pandemic, which necessitated consideration that the patient might be at risk for infection with the SARS-CoV-2 virus that causes COVID-19. Institutional protocols and algorithms that pertain to the  evaluation of patients at risk for COVID-19 are in a state of  rapid change based on information released by regulatory bodies including the CDC and federal and state organizations. These policies and algorithms were followed during the patient's care.

## 2020-12-10 NOTE — ED Notes (Signed)
Updated vitals on pt. Put monitor on standby because she wants to be able to move around. Nurse is aware.

## 2020-12-10 NOTE — ED Provider Notes (Signed)
[facility] EMERGENCY DEPARTMENT Provider Note   CSN: [id number] Arrival date & time: 12/10/20  1028     History Chief Complaint  Patient presents with  . Chest Pain    Theresa Matthews is a 65 y.o. female history includes anxiety, hyperlipidemia, borderline elevated blood pressure  Patient arrives for evaluation of chest pain onset 9:17 AM this morning she was sitting in the car on her way to church when she developed central chest squeezing pressure which was moderate-severe in intensity radiating up into her bilateral jaw, lasted around 20 minutes her husband drove her to a pharmacy where she picked up a bottle of aspirin and took 325 mg prior to arrival.  She has been chest pain-free since that time.  Patient reports family history of heart disease her father had cardiac bypass in his 29s.  Patient denies recent illness, fever/chills, cough/hemoptysis, shortness of breath, abdominal pain, vomiting, diarrhea, extremity swelling/color change or any additional concerns HPI     Past Medical History:  Diagnosis Date  . Anxiety     Patient Active Problem List   Diagnosis Date Noted  . Snoring, using CPAP 03/02/2018  . Anxiety 03/02/2018    Past Surgical History:  Procedure Laterality Date  . APPENDECTOMY       OB History   No obstetric history on file.     Family History  Problem Relation Age of Onset  . Hypertension Mother   . Hypothyroidism Mother   . Heart disease Father   . Bladder Cancer Father   . Brain cancer Brother     Social History   Tobacco Use  . Smoking status: Never Smoker  . Smokeless tobacco: Never Used  Vaping Use  . Vaping Use: Never used  Substance Use Topics  . Alcohol use: Not Currently  . Drug use: Never    Home Medications Prior to Admission medications   Medication Sig Start Date End Date Taking? Authorizing Provider  PARoxetine (PAXIL) 10 MG tablet Take 1 tablet (10 mg total) by mouth daily. 06/03/18   Caren Macadam, MD    Allergies    Patient has no known allergies.  Review of Systems   Review of Systems Ten systems are reviewed and are negative for acute change except as noted in the HPI  Physical Exam Updated Vital Signs BP (!) 168/74 (BP Location: Right Arm)   Pulse 67   Temp 98.6 F (37 C) (Oral)   Resp 16   SpO2 100%   Physical Exam Constitutional:      General: She is not in acute distress.    Appearance: Normal appearance. She is well-developed. She is not ill-appearing or diaphoretic.  HENT:     Head: Normocephalic and atraumatic.  Eyes:     General: Vision grossly intact. Gaze aligned appropriately.     Pupils: Pupils are equal, round, and reactive to light.  Neck:     Trachea: Trachea and phonation normal.  Cardiovascular:     Rate and Rhythm: Normal rate and regular rhythm.     Pulses:          Radial pulses are 2+ on the right side and 2+ on the left side.       Dorsalis pedis pulses are 2+ on the right side and 2+ on the left side.     Heart sounds: Normal heart sounds.  Pulmonary:     Effort: Pulmonary effort is normal. No respiratory distress.  Abdominal:     General:  There is no distension.     Palpations: Abdomen is soft.     Tenderness: There is no abdominal tenderness. There is no guarding or rebound.  Musculoskeletal:        General: Normal range of motion.     Cervical back: Normal range of motion.     Right lower leg: No tenderness. No edema.     Left lower leg: No tenderness. No edema.  Skin:    General: Skin is warm and dry.  Neurological:     Mental Status: She is alert.     GCS: GCS eye subscore is 4. GCS verbal subscore is 5. GCS motor subscore is 6.     Comments: Speech is clear and goal oriented, follows commands Major Cranial nerves without deficit, no facial droop Moves extremities without ataxia, coordination intact  Psychiatric:        Behavior: Behavior normal.     ED Results / Procedures / Treatments   Labs (all labs ordered  are listed, but only abnormal results are displayed) Labs Reviewed  CBC - Abnormal; Notable for the following components:      Result Value   Hemoglobin 15.5 (*)    HCT 46.5 (*)    All other components within normal limits  BASIC METABOLIC PANEL  TROPONIN I (HIGH SENSITIVITY)  TROPONIN I (HIGH SENSITIVITY)    EKG EKG Interpretation  Date/Time:  Sunday December 10 2020 10:46:45 EST Ventricular Rate:  63 PR Interval:  140 QRS Duration: 80 QT Interval:  394 QTC Calculation: 403 R Axis:   81 Text Interpretation: Normal sinus rhythm Nonspecific ST abnormality Abnormal ECG No old tracing to compare Confirmed by Davonna Belling [phone]) on 12/10/2020 5:38:57 PM   Radiology DG Chest 2 View  Result Date: 12/10/2020 CLINICAL DATA:  65 year old female with chest pain. EXAM: CHEST - 2 VIEW COMPARISON:  None FINDINGS: Hilar prominence is noted, most notably the RIGHT hilar region on the LATERAL view The cardiomediastinal silhouette is otherwise unremarkable. There is no evidence of focal airspace disease, pulmonary edema, suspicious pulmonary nodule/mass, pleural effusion, or pneumothorax. No acute bony abnormalities are identified. IMPRESSION: 1. Hilar prominence, notably on the LATERAL view. If no outside prior studies are available for comparison to assess stability, chest CT with contrast is recommended. Electronically Signed   By: Margarette Canada M.D.   On: 12/10/2020 11:39    Procedures Procedures   Medications Ordered in ED Medications - No data to display  ED Course  I have reviewed the triage vital signs and the nursing notes.  Pertinent labs & imaging results that were available during my care of the patient were reviewed by me and considered in my medical decision making (see chart for details).    MDM Rules/Calculators/A&P                         Additional history obtained from: 1. Nursing notes from this visit. 2. EMR review. ------------ I ordered, reviewed and  interpreted labs which include: CBC shows no leukocytosis to suggest infectious process, mild elevation of hemoglobin of 15.5. Initial troponin within normal limits. BMP within normal limits.  CXR:  IMPRESSION:  1. Hilar prominence, notably on the LATERAL view. If no outside  prior studies are available for comparison to assess stability,  chest CT with contrast is recommended.   EKG: Normal sinus rhythm Nonspecific ST abnormality Abnormal ECG No old tracing to compare Confirmed by Davonna Belling [phone]) on 12/10/2020 5:38:57  PM - Patient reassessed she was updated on x-ray findings above she states understanding and is agreeable for CT imaging.  Patient's heart score is 5 today, will consult hospitalist team for admission for cardiac work-up.  Low suspicion for pulmonary embolism, dissection, anemia or other emergent cardiopulmonary etiologies at this time.  Patient is agreeable to plan of care.  She remains chest pain-free on reassessment. - 6:27 PM: Consult with Dr. Roel Cluck, patient accepted for admission.  Patient seen and evaluated by Dr. Alvino Chapel during this visit who agrees with plan of care.  Note: Portions of this report may have been transcribed using voice recognition software. Every effort was made to ensure accuracy; however, inadvertent computerized transcription errors may still be present. Final Clinical Impression(s) / ED Diagnoses Final diagnoses:  Chest pain, unspecified type    Rx / DC Orders ED Discharge Orders    None       Gari Crown 12/10/20 1836    Davonna Belling, MD 12/10/20 2010

## 2020-12-10 NOTE — ED Triage Notes (Addendum)
C/o sharp pain to center of chest that started at 9:17am while going to church and lasted approx 20 min.  Reports fullness to throat, ears, and jaw.  Denies pain at present.  Denies SOB, nausea, and vomiting.

## 2020-12-11 ENCOUNTER — Ambulatory Visit (HOSPITAL_COMMUNITY): Payer: Medicare Other | Attending: Internal Medicine

## 2020-12-11 DIAGNOSIS — R0789 Other chest pain: Secondary | ICD-10-CM | POA: Diagnosis not present

## 2020-12-11 DIAGNOSIS — I340 Nonrheumatic mitral (valve) insufficiency: Secondary | ICD-10-CM | POA: Diagnosis not present

## 2020-12-11 DIAGNOSIS — R079 Chest pain, unspecified: Secondary | ICD-10-CM | POA: Diagnosis not present

## 2020-12-11 DIAGNOSIS — E785 Hyperlipidemia, unspecified: Secondary | ICD-10-CM | POA: Diagnosis not present

## 2020-12-11 DIAGNOSIS — Z79899 Other long term (current) drug therapy: Secondary | ICD-10-CM | POA: Diagnosis not present

## 2020-12-11 DIAGNOSIS — I10 Essential (primary) hypertension: Secondary | ICD-10-CM | POA: Diagnosis not present

## 2020-12-11 DIAGNOSIS — Z20822 Contact with and (suspected) exposure to covid-19: Secondary | ICD-10-CM | POA: Diagnosis not present

## 2020-12-11 DIAGNOSIS — R030 Elevated blood-pressure reading, without diagnosis of hypertension: Secondary | ICD-10-CM | POA: Diagnosis not present

## 2020-12-11 DIAGNOSIS — F419 Anxiety disorder, unspecified: Secondary | ICD-10-CM | POA: Diagnosis not present

## 2020-12-11 LAB — CBC WITH DIFFERENTIAL/PLATELET
Abs Immature Granulocytes: 0.02 10*3/uL (ref 0.00–0.07)
Basophils Absolute: 0 10*3/uL (ref 0.0–0.1)
Basophils Relative: 1 %
Eosinophils Absolute: 0 10*3/uL (ref 0.0–0.5)
Eosinophils Relative: 1 %
HCT: 43.2 % (ref 36.0–46.0)
Hemoglobin: 14.6 g/dL (ref 12.0–15.0)
Immature Granulocytes: 0 %
Lymphocytes Relative: 16 %
Lymphs Abs: 0.9 10*3/uL (ref 0.7–4.0)
MCH: 31.6 pg (ref 26.0–34.0)
MCHC: 33.8 g/dL (ref 30.0–36.0)
MCV: 93.5 fL (ref 80.0–100.0)
Monocytes Absolute: 0.5 10*3/uL (ref 0.1–1.0)
Monocytes Relative: 9 %
Neutro Abs: 3.9 10*3/uL (ref 1.7–7.7)
Neutrophils Relative %: 73 %
Platelets: 295 10*3/uL (ref 150–400)
RBC: 4.62 MIL/uL (ref 3.87–5.11)
RDW: 12.6 % (ref 11.5–15.5)
WBC: 5.4 10*3/uL (ref 4.0–10.5)
nRBC: 0 % (ref 0.0–0.2)

## 2020-12-11 LAB — COMPREHENSIVE METABOLIC PANEL
ALT: 15 U/L (ref 0–44)
AST: 20 U/L (ref 15–41)
Albumin: 3.8 g/dL (ref 3.5–5.0)
Alkaline Phosphatase: 47 U/L (ref 38–126)
Anion gap: 14 (ref 5–15)
BUN: 11 mg/dL (ref 8–23)
CO2: 25 mmol/L (ref 22–32)
Calcium: 9.4 mg/dL (ref 8.9–10.3)
Chloride: 103 mmol/L (ref 98–111)
Creatinine, Ser: 0.73 mg/dL (ref 0.44–1.00)
GFR, Estimated: >60 mL/min (ref >60)
Glucose, Bld: 100 mg/dL — ABNORMAL HIGH (ref 70–99)
Potassium: 3.5 mmol/L (ref 3.5–5.1)
Sodium: 142 mmol/L (ref 135–145)
Total Bilirubin: 0.6 mg/dL (ref 0.3–1.2)
Total Protein: 6.5 g/dL (ref 6.5–8.1)

## 2020-12-11 LAB — ECHOCARDIOGRAM COMPLETE
Area-P 1/2: 3.6 cm2
Calc EF: 58.4 %
Height: 66 in
S' Lateral: 3 cm
Single Plane A2C EF: 63.9 %
Single Plane A4C EF: 55.5 %
Weight: 2380.97 oz

## 2020-12-11 LAB — LIPID PANEL
Cholesterol: 234 mg/dL — ABNORMAL HIGH (ref 0–200)
HDL: 72 mg/dL (ref >40)
LDL Cholesterol: 135 mg/dL — ABNORMAL HIGH (ref 0–99)
Total CHOL/HDL Ratio: 3.3 RATIO
Triglycerides: 135 mg/dL (ref <150)
VLDL: 27 mg/dL (ref 0–40)

## 2020-12-11 LAB — TSH: TSH: 2.497 u[IU]/mL (ref 0.350–4.500)

## 2020-12-11 LAB — PHOSPHORUS: Phosphorus: 4.1 mg/dL (ref 2.5–4.6)

## 2020-12-11 LAB — HEMOGLOBIN A1C
Hgb A1c MFr Bld: 5.4 % (ref 4.8–5.6)
Mean Plasma Glucose: 108.28 mg/dL

## 2020-12-11 LAB — MAGNESIUM: Magnesium: 2.3 mg/dL (ref 1.7–2.4)

## 2020-12-11 LAB — HIV ANTIBODY (ROUTINE TESTING W REFLEX): HIV Screen 4th Generation wRfx: NONREACTIVE

## 2020-12-11 MED ORDER — ASPIRIN 81 MG PO TBEC: 81.0000 mg | DELAYED_RELEASE_TABLET | Freq: Every day | ORAL | 11 refills | Status: DC

## 2020-12-11 MED ORDER — METOPROLOL TARTRATE 25 MG PO TABS
25.0000 mg | ORAL_TABLET | Freq: Two times a day (BID) | ORAL | Status: DC
  Administered 2020-12-11: 25 mg via ORAL
  Filled 2020-12-11: qty 1

## 2020-12-11 MED ORDER — METOPROLOL TARTRATE 25 MG PO TABS: 25.0000 mg | ORAL_TABLET | Freq: Two times a day (BID) | ORAL | 6 refills | Status: DC

## 2020-12-11 MED ORDER — ROSUVASTATIN CALCIUM 5 MG PO TABS: 5.0000 mg | ORAL_TABLET | Freq: Every day | ORAL | 11 refills | Status: DC

## 2020-12-11 NOTE — Consult Note (Addendum)
Cardiology Consultation:   Patient ID: Theresa Matthews MRN: [id number]; DOB: 1956/02/24  Admit date: 12/10/2020 Date of Consult: 12/11/2020  PCP:  Dairl Ponder, [place]  Cardiologist: New, will be followed at [facility] office as patient lives in [place], [place]  Patient Profile:   Debe Anfinson is a 65 y.o. female with no significant past medical history except anxiety who is being seen today for the evaluation of chest pain at the request of Dr. Roel Cluck.   History of Present Illness:   Ms. Brozowski had sudden onset squeezing substernal upper chest tightness.  This was radiated to her jaw with nausea.  She was in the passenger seat to go to church.  Symptoms lasted for 20 minutes with self resolution.  Patient reports similar 5 episodes previously in the last 4 to 6 years.  Each episodes occurred at rest.  This was not her worst episode.  Patient is very active at baseline. She works as a Marine scientist at [employer].  She goes up on flight of stair and does hiking and exercise without chest pain or shortness of breath.  Non-smoker.  Father had CABG at age 56.  Denies orthopnea, PND, syncope, lower extremity edema or melena.  Patient reports intermittent palpitation occurring once every few months, lasting for 15 to 20 minutes.  Her palpitations is not associated with chest pain.  She denies excess stress in her life.  Has daughter wedding tomorrow. Blood pressure was elevated on arrival.  Reports normal at home.  Past Medical History:  Diagnosis Date  . Anxiety     Past Surgical History:  Procedure Laterality Date  . APPENDECTOMY       Inpatient Medications: Scheduled Meds: . aspirin EC  81 mg Oral Daily  . enoxaparin (LOVENOX) injection  40 mg Subcutaneous Q24H  . PARoxetine  10 mg Oral QHS   Continuous Infusions:  PRN Meds: acetaminophen **OR** acetaminophen, HYDROcodone-acetaminophen  Allergies:   No Known Allergies  Social  History:   Social History   Socioeconomic History  . Marital status: Married    Spouse name: Not on file  . Number of children: Not on file  . Years of education: Not on file  . Highest education level: Not on file  Occupational History  . Occupation: Therapist, sports  Tobacco Use  . Smoking status: Never Smoker  . Smokeless tobacco: Never Used  Vaping Use  . Vaping Use: Never used  Substance and Sexual Activity  . Alcohol use: Not Currently  . Drug use: Never  . Sexual activity: Yes    Partners: Male  Other Topics Concern  . Not on file  Social History Narrative   Is a fraternal twin. Grew up in [place].    Married, married for over 52 years.   4 children.    Lives in [place], [place].   Eats all food groups.   Wear seatbelt.   Exercise, hike trails, ride bikes.   Social Determinants of Health   Financial Resource Strain: Not on file  Food Insecurity: Not on file  Transportation Needs: Not on file  Physical Activity: Not on file  Stress: Not on file  Social Connections: Not on file  Intimate Partner Violence: Not on file    Family History:   Family History  Problem Relation Age of Onset  . Hypertension Mother   . Hypothyroidism Mother   . Heart disease Father   . Bladder Cancer Father   . Brain cancer Brother  ROS:  Please see the history of present illness.  All other ROS reviewed and negative.     Physical Exam/Data:   Vitals:   12/10/20 2200 12/11/20 0007 12/11/20 0052 12/11/20 1254  BP: 130/65 128/64 (!) 147/69 (!) 145/60  Pulse: 70 67 61 83  Resp: 18 17 18 18   Temp:  98.1 F (36.7 C) 98.4 F (36.9 C) (!) 97.4 F (36.3 C)  TempSrc:  Oral Oral Axillary  SpO2: 97% 98% 97%   Weight:   67.5 kg   Height:   5\' 6"  (1.676 m)    No intake or output data in the 24 hours ending 12/11/20 1422 Last 3 Weights 12/11/2020 04/20/2020 03/02/2018  Weight (lbs) 148 lb 13 oz 149 lb 157 lb  Weight (kg) 67.5 kg 67.586 kg 71.215 kg     Body mass index is 24.02 kg/m.   General:  Well nourished, well developed, in no acute distress HEENT: normal Lymph: no adenopathy Neck: no JVD Endocrine:  No thryomegaly Vascular: No carotid bruits; FA pulses 2+ bilaterally without bruits  Cardiac:  normal S1, S2; RRR; no murmur  Lungs:  clear to auscultation bilaterally, no wheezing, rhonchi or rales  Abd: soft, nontender, no hepatomegaly  Ext: no edema Musculoskeletal:  No deformities, BUE and BLE strength normal and equal Skin: warm and dry  Neuro:  CNs 2-12 intact, no focal abnormalities noted Psych:  Normal affect   EKG:  The EKG was personally reviewed and demonstrates: Sinus rhythm with nonspecific ST abnormality Telemetry:  Telemetry was personally reviewed and demonstrates: Sinus rhythm with intermittent sinus tachycardia in the 110s to 120s range  Relevant CV Studies:  Echo 12/11/20 1. Left ventricular ejection fraction, by estimation, is 60 to 65%. The  left ventricle has normal function. The left ventricle has no regional  wall motion abnormalities. There is mild left ventricular hypertrophy.  Left ventricular diastolic parameters  are indeterminate.  2. Right ventricular systolic function is normal. The right ventricular  size is normal.  3. The mitral valve is normal in structure. Mild mitral valve  regurgitation.  4. The aortic valve is tricuspid. Aortic valve regurgitation is not  visualized. Mild aortic valve sclerosis is present, with no evidence of  aortic valve stenosis.  5. The inferior vena cava is dilated in size with >50% respiratory  variability, suggesting right atrial pressure of 8 mmHg.   Laboratory Data:  High Sensitivity Troponin:   Recent Labs  Lab 12/10/20 1058 12/10/20 1645  TROPONINIHS 4 4     Chemistry Recent Labs  Lab 12/10/20 1058 12/11/20 0244  NA 140 142  K 3.9 3.5  CL 104 103  CO2 29 25  GLUCOSE 99 100*  BUN 11 11  CREATININE 0.73 0.73  CALCIUM 9.5 9.4  GFRNONAA >60 >60  ANIONGAP 7 14     Recent Labs  Lab 12/10/20 1851 12/11/20 0244  PROT 6.6 6.5  ALBUMIN 4.0 3.8  AST 18 20  ALT 15 15  ALKPHOS 45 47  BILITOT 0.5 0.6   Hematology Recent Labs  Lab 12/10/20 1058 12/11/20 0244  WBC 5.2 5.4  RBC 4.89 4.62  HGB 15.5* 14.6  HCT 46.5* 43.2  MCV 95.1 93.5  MCH 31.7 31.6  MCHC 33.3 33.8  RDW 12.4 12.6  PLT 288 295    Radiology/Studies:  DG Chest 2 View  Result Date: 12/10/2020 CLINICAL DATA:  65 year old female with chest pain. EXAM: CHEST - 2 VIEW COMPARISON:  None FINDINGS: Hilar prominence is noted,  most notably the RIGHT hilar region on the LATERAL view The cardiomediastinal silhouette is otherwise unremarkable. There is no evidence of focal airspace disease, pulmonary edema, suspicious pulmonary nodule/mass, pleural effusion, or pneumothorax. No acute bony abnormalities are identified. IMPRESSION: 1. Hilar prominence, notably on the LATERAL view. If no outside prior studies are available for comparison to assess stability, chest CT with contrast is recommended. Electronically Signed   By: Margarette Canada M.D.   On: 12/10/2020 11:39   CT Chest W Contrast  Result Date: 12/10/2020 CLINICAL DATA:  Lung opacities. EXAM: CT CHEST WITH CONTRAST TECHNIQUE: Multidetector CT imaging of the chest was performed during intravenous contrast administration. CONTRAST:  1mL OMNIPAQUE IOHEXOL 300 MG/ML  SOLN COMPARISON:  Chest x-ray from the same day FINDINGS: Cardiovascular: There is no evidence for thoracic aortic dissection or aneurysm. The heart size is normal. There is no large centrally located pulmonary embolism. No significant pericardial effusion. The arch vessels are patent where visualized. Mediastinum/Nodes: -- No mediastinal lymphadenopathy. -- No hilar lymphadenopathy. -- No axillary lymphadenopathy. -- No supraclavicular lymphadenopathy. -- Normal thyroid gland where visualized. -  Unremarkable esophagus. Lungs/Pleura: Airways are patent. No pleural effusion, lobar  consolidation, pneumothorax or pulmonary infarction. Upper Abdomen: Contrast bolus timing is not optimized for evaluation of the abdominal organs. The visualized portions of the organs of the upper abdomen are normal. Musculoskeletal: No chest wall abnormality. No bony spinal canal stenosis. Review of the MIP images confirms the above findings. IMPRESSION: No acute findings in the chest. The lungs are clear. No findings to explain the patient's recent chest x-ray. Electronically Signed   By: Constance Holster M.D.   On: 12/10/2020 19:10   ECHOCARDIOGRAM COMPLETE  Result Date: 12/11/2020    ECHOCARDIOGRAM REPORT   Patient Name:   ARASELY AKKERMAN Date of Exam: 12/11/2020 Medical Rec #:  [id number]     Height:       66.0 in Accession #:    [id number]    Weight:       148.8 lb Date of Birth:  September 15, 1956     BSA:          1.764 m Patient Age:    50 years      BP:           147/69 mmHg Patient Gender: F             HR:           60 bpm. Exam Location:  Inpatient Procedure: 2D Echo, 3D Echo, Cardiac Doppler and Color Doppler Indications:    R07.9* Chest pain, unspecified  History:        Patient has no prior history of Echocardiogram examinations.                 Abnormal ECG, Signs/Symptoms:Chest Pain; Risk                 Factors:Hypertension and Dyslipidemia.  Sonographer:    Roseanna Rainbow RDCS Referring Phys: [place]  1. Left ventricular ejection fraction, by estimation, is 60 to 65%. The left ventricle has normal function. The left ventricle has no regional wall motion abnormalities. There is mild left ventricular hypertrophy. Left ventricular diastolic parameters are indeterminate.  2. Right ventricular systolic function is normal. The right ventricular size is normal.  3. The mitral valve is normal in structure. Mild mitral valve regurgitation.  4. The aortic valve is tricuspid. Aortic valve regurgitation is not visualized. Mild aortic valve sclerosis is present, with  no evidence of aortic  valve stenosis.  5. The inferior vena cava is dilated in size with >50% respiratory variability, suggesting right atrial pressure of 8 mmHg. FINDINGS  Left Ventricle: Left ventricular ejection fraction, by estimation, is 60 to 65%. The left ventricle has normal function. The left ventricle has no regional wall motion abnormalities. The left ventricular internal cavity size was normal in size. There is  mild left ventricular hypertrophy. Left ventricular diastolic parameters are indeterminate. Right Ventricle: The right ventricular size is normal. Right vetricular wall thickness was not well visualized. Right ventricular systolic function is normal. Left Atrium: Left atrial size was normal in size. Right Atrium: Right atrial size was normal in size. Pericardium: There is no evidence of pericardial effusion. Mitral Valve: The mitral valve is normal in structure. Mild mitral valve regurgitation. Tricuspid Valve: The tricuspid valve is normal in structure. Tricuspid valve regurgitation is trivial. Aortic Valve: The aortic valve is tricuspid. Aortic valve regurgitation is not visualized. Mild aortic valve sclerosis is present, with no evidence of aortic valve stenosis. Pulmonic Valve: The pulmonic valve was not well visualized. Pulmonic valve regurgitation is not visualized. Aorta: The aortic root is normal in size and structure. Venous: The inferior vena cava is dilated in size with greater than 50% respiratory variability, suggesting right atrial pressure of 8 mmHg. IAS/Shunts: No atrial level shunt detected by color flow Doppler.  LEFT VENTRICLE PLAX 2D LVIDd:         4.20 cm     Diastology LVIDs:         3.00 cm     LV e' medial:    4.68 cm/s LV PW:         1.40 cm     LV E/e' medial:  17.1 LV IVS:        1.10 cm     LV e' lateral:   9.68 cm/s LVOT diam:     2.00 cm     LV E/e' lateral: 8.3 LV SV:         73 LV SV Index:   41 LVOT Area:     3.14 cm  LV Volumes (MOD) LV vol d, MOD A2C: 90.8 ml LV vol d, MOD A4C: 95.1  ml LV vol s, MOD A2C: 32.8 ml LV vol s, MOD A4C: 42.3 ml LV SV MOD A2C:     58.0 ml LV SV MOD A4C:     95.1 ml LV SV MOD BP:      56.9 ml RIGHT VENTRICLE             IVC RV S prime:     12.90 cm/s  IVC diam: 2.20 cm TAPSE (M-mode): 2.2 cm LEFT ATRIUM             Index       RIGHT ATRIUM          Index LA diam:        2.40 cm 1.36 cm/m  RA Area:     9.75 cm LA Vol (A2C):   18.9 ml 10.72 ml/m RA Volume:   19.90 ml 11.28 ml/m LA Vol (A4C):   31.2 ml 17.69 ml/m LA Biplane Vol: 24.8 ml 14.06 ml/m  AORTIC VALVE LVOT Vmax:   102.00 cm/s LVOT Vmean:  69.400 cm/s LVOT VTI:    0.231 m  AORTA Ao Root diam: 3.00 cm Ao Asc diam:  2.90 cm MITRAL VALVE MV Area (PHT): 3.60 cm     SHUNTS MV Decel Time:  211 msec     Systemic VTI:  0.23 m MV E velocity: 79.90 cm/s   Systemic Diam: 2.00 cm MV A velocity: 107.00 cm/s MV E/A ratio:  0.75 Dorris Carnes MD Electronically signed by Dorris Carnes MD Signature Date/Time: 12/11/2020/11:54:17 AM    Final      Assessment and Plan:   1. Chest pain 2. Elevated blood pressure without diagnosis of hypertension 3. Intermittent sinus tachycardia with history of palpitation  - Patient has been ruled out.  Troponin negative.  EKG without acute ischemic changes.  She was hypertensive at 160s on arrival.  She does not have significant cardiac risk factors except father requiring CABG at age 71.  At baseline patient is active and does not have exertional symptoms. -Differential includes tachycardia mediated chest pain.  Heart rate stable in 70s however intermittently goes to 110-120s for which she is asymptomatic here.  However, she does reported intermittent tachycardia at home.  Never really pay attention to heart rate or blood pressure at that time. -Echocardiogram reassuring with normal LV function without wall motion abnormality.  Patient denies excess stress in her life. -Discussed closely monitoring symptoms versus addition of beta-blocker versus monitor.  4.  Hyperlipidemia -  12/11/2020: Cholesterol 234; HDL 72; LDL Cholesterol 135; Triglycerides 135; VLDL 27  - Consider low dose statin   Risk Assessment/Risk Scores:  [id number]}   HEAR Score (for undifferentiated chest pain):  HEAR Score: 5{  For questions or updates, please contact [place] Please consult [email or web address] for contact info under    Jarrett Soho, [place]  12/11/2020 2:22 PM    Attending Note:   The patient was seen and examined.  Agree with assessment and plan as noted above.  Changes made to the above note as needed.  Patient seen and independently examined with  Robbie Lis, PA .   We discussed all aspects of the encounter. I agree with the assessment and plan as stated above.  1.   Chest pain :   Atypical ,  Occurs rarely and randomly.   Always at rest.   She is very active - hikes, mows her lawn without any CP or dyspnea Troponin levels were negative x 2.   Echo shows normal LV function  Her daughter is getting married tomorrow and she would like to be able to go to the wedding if possible.  She has had some intermittant episodes of sinus tach on tele.  Perhaps these are related to her atypical cp   At this point , I would like to add metoprololol 25 BID   She will follow up in our [place] office. Further work up will be done up in [place].   2.  Hyperlipidemia:   Borderline:   Consider low dose rosuvastatin     I have spent a total of 40 minutes with patient reviewing hospital  notes , telemetry, EKGs, labs and examining patient as well as establishing an assessment and plan that was discussed with the patient. > 50% of time was spent in direct patient care.    Thayer Headings, Brooke Bonito., MD, [facility] 12/11/2020, 3:00 PM [address]  [place] Pager [phone]

## 2020-12-11 NOTE — Progress Notes (Signed)
Admitted patient from ED ,AAO X4, denies any chest discomforts nor SOB. Routine adm care rendered. Refused IVF to be started stated " I don't need it , I'm drinking enough " Importance of tx explained to pt.

## 2020-12-11 NOTE — Progress Notes (Signed)
  Echocardiogram 2D Echocardiogram has been performed.  Bobbye Charleston 12/11/2020, 8:33 AM

## 2020-12-11 NOTE — Discharge Summary (Signed)
Physician Discharge Summary  Theresa Matthews UVO:[id number] DOB: 1956/03/11 DOA: 12/10/2020  PCP: Dairl Ponder, MD  Admit date: 12/10/2020 Discharge date: 12/11/2020  Recommendations for Outpatient Follow-up:  1. Discharge to home. 2. Follow up with PCP in 7-10 days. 3. Follow up with cardiology as directed.   Follow-up Information    Imogene Burn, PA-C. Go on 01/15/2021.   Specialty: Cardiology Why: @1 :30pm for cardiology hospital follow up  Contact information: [address] [place] [address] [phone]              Discharge Diagnoses: Principal diagnosis is #1 1. Chest pain 2. Anxiety 3. Hyperlipidemia 4. Hypertension  Discharge Condition: Fair  Disposition: Home  Diet recommendation: Heart healthy  Filed Weights   12/11/20 0052  Weight: 67.5 kg    History of present illness:  The patient is a 65 yr old woman who carries a past medical history significant for hyperlipidemia, anxiety, and hypertension. She presented to the [facility] ED with complaints of squeezing chest pain that occurred on her way to church Sunday am. It radiated to her jaw bilaterally and lasted 20 minutes. It was self limited. She has had 5 previous similar episodes. Not exertional, pleuritic, or associated with eating. The patient's daughter is getting married tomorrow.   HEART score is 6. Car  Hospital Course:  [facility] were consulted to admit the patient for further evaluation and treatment. The patient was admitted to a telemetry bed.  EKG demonstrated no ischemic changes. Troponins were negative and flat. LDL was 135. Cardiology was consulted.  Echocardiogram was obtained. It demonstrated EF of 60-65%. The LV had normal function. There was no regional wall motion abnormalities. There is mild LVH. Left ventricular diastolic parameters are indeterminate.  The patient has been evaluated by Dr. Acie Fredrickson. The patient has been ruled out for MI by EKG and  enzyme criteria. However, she was found to be hypertensive and tachycardic. The patient has been started on metoprolol 25 mg bid. As her LDL was increased cardiology has also recommended initiation of crestor at a low dose.  The patient has been cleared for discharge to home.  Today's assessment: S: The patient is resting comfortably. No further chest pain. O: Vitals:  Vitals:   12/11/20 0052 12/11/20 1254  BP: (!) 147/69 (!) 145/60  Pulse: 61 83  Resp: 18 18  Temp: 98.4 F (36.9 C) (!) 97.4 F (36.3 C)  SpO2: 97%     Exam:  Constitutional:  . The patient is awake, alert, and oriented x 3. No acute distress. Respiratory:  . No increased work of breathing. . No wheezes, rales, or rhonchi . No tactile fremitus Cardiovascular:  . Regular rate and rhythm . No murmurs, ectopy, or gallups. . No lateral PMI. No thrills. Abdomen:  . Abdomen is soft, non-tender, non-distended . No hernias, masses, or organomegaly . Normoactive bowel sounds.  Musculoskeletal:  . No cyanosis, clubbing, or edema Skin:  . No rashes, lesions, ulcers . palpation of skin: no induration or nodules Neurologic:  . CN 2-12 intact . Sensation all 4 extremities intact Psychiatric:  . Mental status o Mood, affect appropriate o Orientation to person, place, time  . judgment and insight appear intact   Discharge Instructions  Discharge Instructions    Activity as tolerated - No restrictions   Complete by: As directed    Call MD for:  difficulty breathing, headache or visual disturbances   Complete by: As directed    Call MD  for:  persistant nausea and vomiting   Complete by: As directed    Call MD for:  severe uncontrolled pain   Complete by: As directed    Diet - low sodium heart healthy   Complete by: As directed    Discharge instructions   Complete by: As directed    Discharge to home. Follow up with PCP in 7-10 days. Follow up with cardiology as directed.   Increase activity slowly    Complete by: As directed      Allergies as of 12/11/2020   No Known Allergies     Medication List    TAKE these medications   aspirin 81 MG EC tablet Take 1 tablet (81 mg total) by mouth daily. Swallow whole. Start taking on: December 12, 2020   metoprolol tartrate 25 MG tablet Commonly known as: LOPRESSOR Take 1 tablet (25 mg total) by mouth 2 (two) times daily.   PARoxetine 10 MG tablet Commonly known as: PAXIL Take 1 tablet (10 mg total) by mouth daily. What changed: when to take this   rosuvastatin 5 MG tablet Commonly known as: Crestor Take 1 tablet (5 mg total) by mouth daily.      No Known Allergies  The results of significant diagnostics from this hospitalization (including imaging, microbiology, ancillary and laboratory) are listed below for reference.    Significant Diagnostic Studies: DG Chest 2 View  Result Date: 12/10/2020 CLINICAL DATA:  65 year old female with chest pain. EXAM: CHEST - 2 VIEW COMPARISON:  None FINDINGS: Hilar prominence is noted, most notably the RIGHT hilar region on the LATERAL view The cardiomediastinal silhouette is otherwise unremarkable. There is no evidence of focal airspace disease, pulmonary edema, suspicious pulmonary nodule/mass, pleural effusion, or pneumothorax. No acute bony abnormalities are identified. IMPRESSION: 1. Hilar prominence, notably on the LATERAL view. If no outside prior studies are available for comparison to assess stability, chest CT with contrast is recommended. Electronically Signed   By: Margarette [place] M.D.   On: 12/10/2020 11:39   CT Chest W Contrast  Result Date: 12/10/2020 CLINICAL DATA:  Lung opacities. EXAM: CT CHEST WITH CONTRAST TECHNIQUE: Multidetector CT imaging of the chest was performed during intravenous contrast administration. CONTRAST:  27mL OMNIPAQUE IOHEXOL 300 MG/ML  SOLN COMPARISON:  Chest x-ray from the same day FINDINGS: Cardiovascular: There is no evidence for thoracic aortic dissection or  aneurysm. The heart size is normal. There is no large centrally located pulmonary embolism. No significant pericardial effusion. The arch vessels are patent where visualized. Mediastinum/Nodes: -- No mediastinal lymphadenopathy. -- No hilar lymphadenopathy. -- No axillary lymphadenopathy. -- No supraclavicular lymphadenopathy. -- Normal thyroid gland where visualized. -  Unremarkable esophagus. Lungs/Pleura: Airways are patent. No pleural effusion, lobar consolidation, pneumothorax or pulmonary infarction. Upper Abdomen: Contrast bolus timing is not optimized for evaluation of the abdominal organs. The visualized portions of the organs of the upper abdomen are normal. Musculoskeletal: No chest wall abnormality. No bony spinal canal stenosis. Review of the MIP images confirms the above findings. IMPRESSION: No acute findings in the chest. The lungs are clear. No findings to explain the patient's recent chest x-ray. Electronically Signed   By: Constance Holster M.D.   On: 12/10/2020 19:10   ECHOCARDIOGRAM COMPLETE  Result Date: 12/11/2020    ECHOCARDIOGRAM REPORT   Patient Name:   Theresa Matthews Date of Exam: 12/11/2020 Medical Rec #:  [id number]     Height:       66.0 in Accession #:    [id number]  Weight:       148.8 lb Date of Birth:  1956-08-01     BSA:          1.764 m Patient Age:    58 years      BP:           147/69 mmHg Patient Gender: F             HR:           60 bpm. Exam Location:  Inpatient Procedure: 2D Echo, 3D Echo, Cardiac Doppler and Color Doppler Indications:    R07.9* Chest pain, unspecified  History:        Patient has no prior history of Echocardiogram examinations.                 Abnormal ECG, Signs/Symptoms:Chest Pain; Risk                 Factors:Hypertension and Dyslipidemia.  Sonographer:    Roseanna Rainbow RDCS Referring Phys: [place]  1. Left ventricular ejection fraction, by estimation, is 60 to 65%. The left ventricle has normal function. The left ventricle  has no regional wall motion abnormalities. There is mild left ventricular hypertrophy. Left ventricular diastolic parameters are indeterminate.  2. Right ventricular systolic function is normal. The right ventricular size is normal.  3. The mitral valve is normal in structure. Mild mitral valve regurgitation.  4. The aortic valve is tricuspid. Aortic valve regurgitation is not visualized. Mild aortic valve sclerosis is present, with no evidence of aortic valve stenosis.  5. The inferior vena cava is dilated in size with >50% respiratory variability, suggesting right atrial pressure of 8 mmHg. FINDINGS  Left Ventricle: Left ventricular ejection fraction, by estimation, is 60 to 65%. The left ventricle has normal function. The left ventricle has no regional wall motion abnormalities. The left ventricular internal cavity size was normal in size. There is  mild left ventricular hypertrophy. Left ventricular diastolic parameters are indeterminate. Right Ventricle: The right ventricular size is normal. Right vetricular wall thickness was not well visualized. Right ventricular systolic function is normal. Left Atrium: Left atrial size was normal in size. Right Atrium: Right atrial size was normal in size. Pericardium: There is no evidence of pericardial effusion. Mitral Valve: The mitral valve is normal in structure. Mild mitral valve regurgitation. Tricuspid Valve: The tricuspid valve is normal in structure. Tricuspid valve regurgitation is trivial. Aortic Valve: The aortic valve is tricuspid. Aortic valve regurgitation is not visualized. Mild aortic valve sclerosis is present, with no evidence of aortic valve stenosis. Pulmonic Valve: The pulmonic valve was not well visualized. Pulmonic valve regurgitation is not visualized. Aorta: The aortic root is normal in size and structure. Venous: The inferior vena cava is dilated in size with greater than 50% respiratory variability, suggesting right atrial pressure of 8 mmHg.  IAS/Shunts: No atrial level shunt detected by color flow Doppler.  LEFT VENTRICLE PLAX 2D LVIDd:         4.20 cm     Diastology LVIDs:         3.00 cm     LV e' medial:    4.68 cm/s LV PW:         1.40 cm     LV E/e' medial:  17.1 LV IVS:        1.10 cm     LV e' lateral:   9.68 cm/s LVOT diam:     2.00 cm  LV E/e' lateral: 8.3 LV SV:         73 LV SV Index:   41 LVOT Area:     3.14 cm  LV Volumes (MOD) LV vol d, MOD A2C: 90.8 ml LV vol d, MOD A4C: 95.1 ml LV vol s, MOD A2C: 32.8 ml LV vol s, MOD A4C: 42.3 ml LV SV MOD A2C:     58.0 ml LV SV MOD A4C:     95.1 ml LV SV MOD BP:      56.9 ml RIGHT VENTRICLE             IVC RV S prime:     12.90 cm/s  IVC diam: 2.20 cm TAPSE (M-mode): 2.2 cm LEFT ATRIUM             Index       RIGHT ATRIUM          Index LA diam:        2.40 cm 1.36 cm/m  RA Area:     9.75 cm LA Vol (A2C):   18.9 ml 10.72 ml/m RA Volume:   19.90 ml 11.28 ml/m LA Vol (A4C):   31.2 ml 17.69 ml/m LA Biplane Vol: 24.8 ml 14.06 ml/m  AORTIC VALVE LVOT Vmax:   102.00 cm/s LVOT Vmean:  69.400 cm/s LVOT VTI:    0.231 m  AORTA Ao Root diam: 3.00 cm Ao Asc diam:  2.90 cm MITRAL VALVE MV Area (PHT): 3.60 cm     SHUNTS MV Decel Time: 211 msec     Systemic VTI:  0.23 m MV E velocity: 79.90 cm/s   Systemic Diam: 2.00 cm MV A velocity: 107.00 cm/s MV E/A ratio:  0.75 Dorris Carnes MD Electronically signed by Dorris Carnes MD Signature Date/Time: 12/11/2020/11:54:17 AM    Final     Microbiology: Recent Results (from the past 240 hour(s))  Resp Panel by RT-PCR (Flu A&B, Covid) Nasopharyngeal Swab     Status: None   Collection Time: 12/10/20  5:45 PM   Specimen: Nasopharyngeal Swab; Nasopharyngeal(NP) swabs in vial transport medium  Result Value Ref Range Status   SARS Coronavirus 2 by RT PCR NEGATIVE NEGATIVE Final    Comment: (NOTE) SARS-CoV-2 target nucleic acids are NOT DETECTED.  The SARS-CoV-2 RNA is generally detectable in upper respiratory specimens during the acute phase of infection. The  lowest concentration of SARS-CoV-2 viral copies this assay can detect is 138 copies/mL. A negative result does not preclude SARS-Cov-2 infection and should not be used as the sole basis for treatment or other patient management decisions. A negative result may occur with  improper specimen collection/handling, submission of specimen other than nasopharyngeal swab, presence of viral mutation(s) within the areas targeted by this assay, and inadequate number of viral copies(<138 copies/mL). A negative result must be combined with clinical observations, patient history, and epidemiological information. The expected result is Negative.  Fact Sheet for Patients:  EntrepreneurPulse.com.au  Fact Sheet for Healthcare Providers:  IncredibleEmployment.be  This test is no t yet approved or cleared by the [place] FDA and  has been authorized for detection and/or diagnosis of SARS-CoV-2 by FDA under an Emergency Use Authorization (EUA). This EUA will remain  in effect (meaning this test can be used) for the duration of the COVID-19 declaration under Section 564(b)(1) of the Act, 21 U.S.C.section 360bbb-3(b)(1), unless the authorization is terminated  or revoked sooner.       Influenza A by PCR NEGATIVE NEGATIVE Final   Influenza  B by PCR NEGATIVE NEGATIVE Final    Comment: (NOTE) The Xpert Xpress SARS-CoV-2/FLU/RSV plus assay is intended as an aid in the diagnosis of influenza from Nasopharyngeal swab specimens and should not be used as a sole basis for treatment. Nasal washings and aspirates are unacceptable for Xpert Xpress SARS-CoV-2/FLU/RSV testing.  Fact Sheet for Patients: EntrepreneurPulse.com.au  Fact Sheet for Healthcare Providers: IncredibleEmployment.be  This test is not yet approved or cleared by the [place] FDA and has been authorized for detection and/or diagnosis of SARS-CoV-2 by FDA under  an Emergency Use Authorization (EUA). This EUA will remain in effect (meaning this test can be used) for the duration of the COVID-19 declaration under Section 564(b)(1) of the Act, 21 U.S.C. section 360bbb-3(b)(1), unless the authorization is terminated or revoked.  Performed at [facility], [place] [address] [place], [place] [address]      Labs: Basic Metabolic Panel: Recent Labs  Lab 12/10/20 1058 12/11/20 0244  NA 140 142  K 3.9 3.5  CL 104 103  CO2 29 25  GLUCOSE 99 100*  BUN 11 11  CREATININE 0.73 0.73  CALCIUM 9.5 9.4  MG  --  2.3  PHOS  --  4.1   Liver Function Tests: Recent Labs  Lab 12/10/20 1851 12/11/20 0244  AST 18 20  ALT 15 15  ALKPHOS 45 47  BILITOT 0.5 0.6  PROT 6.6 6.5  ALBUMIN 4.0 3.8   No results for input(s): LIPASE, AMYLASE in the last 168 hours. No results for input(s): AMMONIA in the last 168 hours. CBC: Recent Labs  Lab 12/10/20 1058 12/11/20 0244  WBC 5.2 5.4  NEUTROABS  --  3.9  HGB 15.5* 14.6  HCT 46.5* 43.2  MCV 95.1 93.5  PLT 288 295   Cardiac Enzymes: No results for input(s): CKTOTAL, CKMB, CKMBINDEX, TROPONINI in the last 168 hours. BNP: BNP (last 3 results) No results for input(s): BNP in the last 8760 hours.  ProBNP (last 3 results) No results for input(s): PROBNP in the last 8760 hours.  CBG: No results for input(s): GLUCAP in the last 168 hours.  Active Problems:   Anxiety   Chest pain   Hyperlipidemia   Elevated BP without diagnosis of hypertension   Time coordinating discharge: 38 minutes.  Signed:        Mattingly Fountaine, DO [facility]  12/11/2020, 3:49 PM

## 2021-01-15 ENCOUNTER — Ambulatory Visit: Payer: 59 | Admitting: Physician Assistant

## 2021-01-18 ENCOUNTER — Ambulatory Visit: Payer: 59 | Admitting: Student

## 2021-01-24 NOTE — Progress Notes (Signed)
Cardiology Office Note    Date:  01/25/2021   ID:  Theresa Matthews, DOB [birth year], MRN [id number]  PCP:  Dairl Ponder, MD  Cardiologist: Evaluated by Dr. Acie Fredrickson during admission but prefers to follow-up in [facility] Complaint  Patient presents with  . Hospitalization Follow-up    History of Present Illness:    Theresa Matthews is a 65 y.o. female with past medical history of family history of CAD and no personal cardiac history who presents to the office today for hospital follow-up.   She was most recently admitted to [facility] from 2/20 - 12/11/2020 for evaluation of chest pain which radiated into her jaw with associated nausea. Symptoms occurred at rest and resolved within 20 minutes. She reported being very active at baseline and denied any exertional symptoms. Hs Troponin values were negative and her EKG showed no acute ST changes. Echocardiogram showed a preserved EF of 60-65% with no regional WMA. She did have mild MR and mild aortic sclerosis without stenosis. She was tachycardiac at times and it was thought this could be contributing, therefore she was started on Lopressor 25mg  BID. FLP did show total cholesterol of 234, HDL 72, triglycerides 135 and LDL 135 and she was started on Crestor 5mg  daily. Her daughter was getting married the next day and given she had ruled-out for ACS, she was discharged home with consideration of additional work-up as an outpatient.   In talking with the patient today, she reports overall doing well since her recent hospitalization. She is active at baseline as she currently works as a Marine scientist here at [employer] and climbs 3 flights of stairs every day without any anginal symptoms. She also reports her and her husband go on trails frequently and denies any anginal symptoms with this. No recent dyspnea on exertion, orthopnea, PND or lower extremity edema. She does report occasional palpitations but says these are infrequent and denies any  associated dizziness or presyncope. She consumes approximately 3 cups of coffee on a daily basis and alcohol only on occasion.   She did not start Crestor or Lopressor following her recent admission. She has been checking her vitals at home and BP has overall been well-controlled with SBP in the 120's to 130's and an occasional outlier with SBP of 140. HR has been in the 60's to 70's.    Past Medical History:  Diagnosis Date  . Anxiety     Past Surgical History:  Procedure Laterality Date  . APPENDECTOMY      Current Medications: Outpatient Medications Prior to Visit  Medication Sig Dispense Refill  . PARoxetine (PAXIL) 10 MG tablet Take 1 tablet (10 mg total) by mouth daily. (Patient taking differently: Take 10 mg by mouth at bedtime.) 90 tablet 1  . aspirin EC 81 MG EC tablet Take 1 tablet (81 mg total) by mouth daily. Swallow whole. 30 tablet 11  . metoprolol tartrate (LOPRESSOR) 25 MG tablet Take 1 tablet (25 mg total) by mouth 2 (two) times daily. 60 tablet 6  . rosuvastatin (CRESTOR) 5 MG tablet Take 1 tablet (5 mg total) by mouth daily. 30 tablet 11   No facility-administered medications prior to visit.     Allergies:   Patient has no known allergies.   Social History   Socioeconomic History  . Marital status: Married    Spouse name: Not on file  . Number of children: Not on file  . Years of education: Not on file  . Highest education  level: Not on file  Occupational History  . Occupation: Therapist, sports  Tobacco Use  . Smoking status: Never Smoker  . Smokeless tobacco: Never Used  Vaping Use  . Vaping Use: Never used  Substance and Sexual Activity  . Alcohol use: Not Currently  . Drug use: Never  . Sexual activity: Yes    Partners: Male  Other Topics Concern  . Not on file  Social History Narrative   Is a fraternal twin. Grew up in [place].    Married, married for over 39 years.   4 children.    Lives in [place], [place].   Eats all food groups.   Wear seatbelt.    Exercise, hike trails, ride bikes.   Social Determinants of Health   Financial Resource Strain: Not on file  Food Insecurity: Not on file  Transportation Needs: Not on file  Physical Activity: Not on file  Stress: Not on file  Social Connections: Not on file     Family History:  The patient's family history includes Bladder Cancer in her father; Brain cancer in her brother; Heart disease in her father; Hypertension in her mother; Hypothyroidism in her mother.   Review of Systems:   Please see the history of present illness.     General:  No chills, fever, night sweats or weight changes.  Cardiovascular:  No chest pain, dyspnea on exertion, edema, orthopnea, palpitations, paroxysmal nocturnal dyspnea. Dermatological: No rash, lesions/masses Respiratory: No cough, dyspnea Urologic: No hematuria, dysuria Abdominal:   No nausea, vomiting, diarrhea, bright red blood per rectum, melena, or hematemesis Neurologic:  No visual changes, wkns, changes in mental status. All other systems reviewed and are otherwise negative except as noted above.   Physical Exam:    VS:  BP (!) 142/78   Pulse 74   Ht 5\' 6"  (1.676 m)   Wt 152 lb (68.9 kg)   SpO2 98%   BMI 24.53 kg/m    General: Well developed, well nourished,female appearing in no acute distress. Head: Normocephalic, atraumatic. Neck: No carotid bruits. JVD not elevated.  Lungs: Respirations regular and unlabored, without wheezes or rales.  Heart: Regular rate and rhythm. No S3 or S4.  No murmur, no rubs, or gallops appreciated. Abdomen: Appears non-distended. No obvious abdominal masses. Msk:  Strength and tone appear normal for age. No obvious joint deformities or effusions. Extremities: No clubbing or cyanosis. No lower extremity edema.  Distal pedal pulses are 2+ bilaterally. Neuro: Alert and oriented X 3. Moves all extremities spontaneously. No focal deficits noted. Psych:  Responds to questions appropriately with a normal  affect. Skin: No rashes or lesions noted  Wt Readings from Last 3 Encounters:  01/25/21 152 lb (68.9 kg)  12/11/20 148 lb 13 oz (67.5 kg)  04/20/20 149 lb (67.6 kg)     Studies/Labs Reviewed:   EKG:  EKG is not ordered today.    Recent Labs: 12/11/2020: ALT 15; BUN 11; Creatinine, Ser 0.73; Hemoglobin 14.6; Magnesium 2.3; Platelets 295; Potassium 3.5; Sodium 142; TSH 2.497   Lipid Panel    Component Value Date/Time   CHOL 234 (H) 12/11/2020 0244   TRIG 135 12/11/2020 0244   HDL 72 12/11/2020 0244   CHOLHDL 3.3 12/11/2020 0244   VLDL 27 12/11/2020 0244   LDLCALC 135 (H) 12/11/2020 0244   LDLCALC 136 (H) 03/02/2018 0927    Additional studies/ records that were reviewed today include:   Echocardiogram: 11/2020 IMPRESSIONS    1. Left ventricular ejection fraction, by estimation,  is 60 to 65%. The  left ventricle has normal function. The left ventricle has no regional  wall motion abnormalities. There is mild left ventricular hypertrophy.  Left ventricular diastolic parameters  are indeterminate.  2. Right ventricular systolic function is normal. The right ventricular  size is normal.  3. The mitral valve is normal in structure. Mild mitral valve  regurgitation.  4. The aortic valve is tricuspid. Aortic valve regurgitation is not  visualized. Mild aortic valve sclerosis is present, with no evidence of  aortic valve stenosis.  5. The inferior vena cava is dilated in size with >50% respiratory  variability, suggesting right atrial pressure of 8 mmHg.   Assessment:    1. Chest pain, unspecified type   2. History of tachycardia   3. Mixed hyperlipidemia      Plan:   In order of problems listed above:   1. Chest Pain - She was recently hospitalized for chest pain which occurred at rest and ruled out for ACS. Echocardiogram showed a preserved EF with no regional motion abnormalities and she did have mild MR. Chest CT showed no evidence of a PE and there was no  mention of coronary calcifications by review of the report. - She is very active at baseline and denies any recent anginal symptoms. Given her asymptomatic state and recent reassuring workup, will not pursue further cardiac testing at this time. I encouraged her to reach out if she develops any new fatigue, chest pain or dyspnea on exertion as further ischemic evaluation could be pursued with a Coronary CT.  2. Tachycardia - Was noted to have episodes of sinus tachycardia during admission and was prescribed Lopressor but did not start this. Her HR and BP have overall been well controlled since returning home.  We discussed possible reduction in caffeine intake if she did have worsening palpitations but given her overall well-controlled symptoms and appropriate HR, will not reinitiate beta-blocker therapy at this time.  3. HLD - FLP during recent admission showed total cholesterol of 234, HDL 72, triglycerides 135 and LDL 135 and she was prescribed  Crestor 5mg  daily.  She does not wish to be on statin therapy at this time.  We discussed possible initiation of this in the future if she was ever found to have coronary calcifications.    Medication Adjustments/Labs and Tests Ordered: Current medicines are reviewed at length with the patient today.  Concerns regarding medicines are outlined above.  Medication changes, Labs and Tests ordered today are listed in the Patient Instructions below. Patient Instructions  Medication Instructions:  Your physician recommends that you continue on your current medications as directed. Please refer to the Current Medication list given to you today.  *If you need a refill on your cardiac medications before your next appointment, please call your pharmacy*   Lab Work: NONE   If you have labs (blood work) drawn today and your tests are completely normal, you will receive your results only by: Marland Kitchen MyChart Message (if you have MyChart) OR . A paper copy in the  mail If you have any lab test that is abnormal or we need to change your treatment, we will call you to review the results.   Testing/Procedures: NONE    Follow-Up: At [facility], you and your health needs are our priority.  As part of our continuing mission to provide you with exceptional heart care, we have created designated Provider Care Teams.  These Care Teams include your primary Cardiologist (physician) and  Advanced Practice Providers (APPs -  Physician Assistants and Nurse Practitioners) who all work together to provide you with the care you need, when you need it.  We recommend signing up for the patient portal called "MyChart".  Sign up information is provided on this After Visit Summary.  MyChart is used to connect with patients for Virtual Visits (Telemedicine).  Patients are able to view lab/test results, encounter notes, upcoming appointments, etc.  Non-urgent messages can be sent to your provider as well.   To learn more about what you can do with MyChart, go to [email or web address].    Your next appointment:   1 year(s)  The format for your next appointment:   In Person  Provider:   Bernerd Pho, PA-C   Other Instructions Thank you for choosing [facility]!     Signed, Erma Heritage, PA-C  01/25/2021 2:13 PM    [facility] [address] [place], [place] [address] Phone: [phone] Fax: [phone]

## 2021-01-25 ENCOUNTER — Encounter: Payer: Self-pay | Admitting: Student

## 2021-01-25 ENCOUNTER — Ambulatory Visit (INDEPENDENT_AMBULATORY_CARE_PROVIDER_SITE_OTHER): Payer: Medicare Other | Admitting: Student

## 2021-01-25 ENCOUNTER — Ambulatory Visit (INDEPENDENT_AMBULATORY_CARE_PROVIDER_SITE_OTHER): Payer: Medicare Other | Admitting: Internal Medicine

## 2021-01-25 ENCOUNTER — Other Ambulatory Visit: Payer: Self-pay

## 2021-01-25 ENCOUNTER — Encounter: Payer: Self-pay | Admitting: Internal Medicine

## 2021-01-25 VITALS — BP 142/78 | HR 74 | Ht 66.0 in | Wt 152.0 lb

## 2021-01-25 VITALS — BP 138/84 | HR 71 | Temp 98.4°F | Ht 66.0 in | Wt 152.0 lb

## 2021-01-25 DIAGNOSIS — R030 Elevated blood-pressure reading, without diagnosis of hypertension: Secondary | ICD-10-CM | POA: Diagnosis not present

## 2021-01-25 DIAGNOSIS — Z87898 Personal history of other specified conditions: Secondary | ICD-10-CM

## 2021-01-25 DIAGNOSIS — E782 Mixed hyperlipidemia: Secondary | ICD-10-CM | POA: Diagnosis not present

## 2021-01-25 DIAGNOSIS — Z7689 Persons encountering health services in other specified circumstances: Secondary | ICD-10-CM

## 2021-01-25 DIAGNOSIS — F419 Anxiety disorder, unspecified: Secondary | ICD-10-CM

## 2021-01-25 DIAGNOSIS — Z2821 Immunization not carried out because of patient refusal: Secondary | ICD-10-CM

## 2021-01-25 DIAGNOSIS — R079 Chest pain, unspecified: Secondary | ICD-10-CM

## 2021-01-25 DIAGNOSIS — R0683 Snoring: Secondary | ICD-10-CM

## 2021-01-25 MED ORDER — PAROXETINE HCL 10 MG PO TABS
10.0000 mg | ORAL_TABLET | Freq: Every day | ORAL | 1 refills | Status: DC
Start: 2021-01-25 — End: 2021-07-23

## 2021-01-25 NOTE — Assessment & Plan Note (Signed)
Care established Previous chart reviewed History and medications reviewed with the patient 

## 2021-01-25 NOTE — Assessment & Plan Note (Signed)
BP Readings from Last 1 Encounters:  01/25/21 138/84   Was placed on Metoprolol mainly for sinus tachycardia, did not start it Currently BP stable Advised DASH diet and moderate exercise/walking, at least 150 mins/week Follows up with Cardiology

## 2021-01-25 NOTE — Assessment & Plan Note (Signed)
Well-controlled with Paxil 

## 2021-01-25 NOTE — Progress Notes (Signed)
New Patient Office Visit  Subjective:  Patient ID: Theresa Matthews, female    DOB: Nov 23, 1955  Age: 65 y.o. MRN: [id number]  CC:  Chief Complaint  Patient presents with  . New Patient (Initial Visit)    Here to establish care, no complaints today. Needs Paxil refilled.    HPI Theresa Matthews is a 65 year old female with PMH of anxiety and HLD who who presents for establishing care.  She was admitted in the hospital for evaluation of chest pain in 11/2020. Her EKG and Echo were unremarkable. She was placed on Metoprolol for sinus tachycardia, but she has not been taking it. Her BP and HR have been stable according to home BP readings. She denies any headache, dizziness, dyspnea or palpitations. She has had atypical chest pain at rest. She went to see Cardiology today and was advised for coronary CT.  She takes Paxil for anxiety. Denies anhedonia, SI or HI.  She has had 2 doses of COVID vaccine. She denies pneumococcal vaccine currently.  Past Medical History:  Diagnosis Date  . Anxiety   . Chest pain 12/10/2020    Past Surgical History:  Procedure Laterality Date  . APPENDECTOMY      Family History  Problem Relation Age of Onset  . Hypertension Mother   . Hypothyroidism Mother   . Heart disease Father   . Bladder Cancer Father   . Brain cancer Brother     Social History   Socioeconomic History  . Marital status: Married    Spouse name: Not on file  . Number of children: Not on file  . Years of education: Not on file  . Highest education level: Not on file  Occupational History  . Occupation: Therapist, sports  Tobacco Use  . Smoking status: Never Smoker  . Smokeless tobacco: Never Used  Vaping Use  . Vaping Use: Never used  Substance and Sexual Activity  . Alcohol use: Not Currently  . Drug use: Never  . Sexual activity: Yes    Partners: Male  Other Topics Concern  . Not on file  Social History Narrative   Is a fraternal twin. Grew up in [place].    Married,  married for over 33 years.   4 children.    Lives in [place], [place].   Eats all food groups.   Wear seatbelt.   Exercise, hike trails, ride bikes.   Social Determinants of Health   Financial Resource Strain: Not on file  Food Insecurity: Not on file  Transportation Needs: Not on file  Physical Activity: Not on file  Stress: Not on file  Social Connections: Not on file  Intimate Partner Violence: Not on file    ROS Review of Systems  Constitutional: Negative for chills and fever.  HENT: Negative for congestion, sinus pressure, sinus pain and sore throat.   Eyes: Negative for pain and discharge.  Respiratory: Negative for cough and shortness of breath.   Cardiovascular: Negative for chest pain and palpitations.  Gastrointestinal: Negative for abdominal pain, constipation, diarrhea, nausea and vomiting.  Endocrine: Negative for polydipsia and polyuria.  Genitourinary: Negative for dysuria and hematuria.  Musculoskeletal: Negative for neck pain and neck stiffness.  Skin: Negative for rash.  Neurological: Negative for dizziness and weakness.  Psychiatric/Behavioral: Negative for agitation and behavioral problems.    Objective:   Today's Vitals: BP 138/84 (BP Location: Right Arm, Patient Position: Sitting, Cuff Size: Normal)   Pulse 71   Temp 98.4 F (36.9 C) (Oral)   Ht  5\' 6"  (1.676 m)   Wt 152 lb (68.9 kg)   SpO2 99%   BMI 24.53 kg/m   Physical Exam Vitals reviewed.  Constitutional:      General: She is not in acute distress.    Appearance: She is not diaphoretic.  HENT:     Head: Normocephalic and atraumatic.     Nose: Nose normal.     Mouth/Throat:     Mouth: Mucous membranes are moist.  Eyes:     General: No scleral icterus.    Extraocular Movements: Extraocular movements intact.  Cardiovascular:     Rate and Rhythm: Normal rate and regular rhythm.     Pulses: Normal pulses.     Heart sounds: No murmur heard.   Pulmonary:     Breath sounds: Normal breath  sounds. No wheezing or rales.  Musculoskeletal:     Cervical back: Neck supple. No tenderness.     Right lower leg: No edema.     Left lower leg: No edema.  Skin:    General: Skin is warm.     Findings: No rash.  Neurological:     General: No focal deficit present.     Mental Status: She is alert and oriented to person, place, and time.  Psychiatric:        Mood and Affect: Mood normal.        Behavior: Behavior normal.     Assessment & Plan:   Problem List Items Addressed This Visit      Encounter to establish care - Primary   Care established Previous chart reviewed History and medications reviewed with the patient       Other   Snoring    Uses snore guard Used to have CPAP, did not tolerate.      Anxiety    Well-controlled with Paxil      Relevant Medications   PARoxetine (PAXIL) 10 MG tablet   Hyperlipidemia    Lipid profile reviewed Advised to follow DASH diet      Elevated BP without diagnosis of hypertension    BP Readings from Last 1 Encounters:  01/25/21 138/84   Was placed on Metoprolol mainly for sinus tachycardia, did not start it Currently BP stable Advised DASH diet and moderate exercise/walking, at least 150 mins/week Follows up with Cardiology            Other Visit Diagnoses    Pneumococcal vaccine refused          Outpatient Encounter Medications as of 01/25/2021  Medication Sig  . [DISCONTINUED] PARoxetine (PAXIL) 10 MG tablet Take 1 tablet (10 mg total) by mouth daily. (Patient taking differently: Take 10 mg by mouth at bedtime.)  . PARoxetine (PAXIL) 10 MG tablet Take 1 tablet (10 mg total) by mouth at bedtime.   No facility-administered encounter medications on file as of 01/25/2021.    Follow-up: Return in about 6 months (around 07/27/2021).   Theresa Spar, MD

## 2021-01-25 NOTE — Assessment & Plan Note (Signed)
Lipid profile reviewed Advised to follow DASH diet 

## 2021-01-25 NOTE — Assessment & Plan Note (Signed)
Uses snore guard Used to have CPAP, did not tolerate.

## 2021-01-25 NOTE — Patient Instructions (Signed)
Please continue taking Paxil as prescribed.  Continue to follow DASH diet and perform moderate exercise/walking at least 150 mins/week.  If your blood pressure at home remains more than 140/90 for 3 consecutive measurements, please contact us.

## 2021-01-25 NOTE — Patient Instructions (Signed)
Medication Instructions:  Your physician recommends that you continue on your current medications as directed. Please refer to the Current Medication list given to you today.  *If you need a refill on your cardiac medications before your next appointment, please call your pharmacy*   Lab Work: NONE   If you have labs (blood work) drawn today and your tests are completely normal, you will receive your results only by: Marland Kitchen MyChart Message (if you have MyChart) OR . A paper copy in the mail If you have any lab test that is abnormal or we need to change your treatment, we will call you to review the results.   Testing/Procedures: NONE    Follow-Up: At [facility], you and your health needs are our priority.  As part of our continuing mission to provide you with exceptional heart care, we have created designated Provider Care Teams.  These Care Teams include your primary Cardiologist (physician) and Advanced Practice Providers (APPs -  Physician Assistants and Nurse Practitioners) who all work together to provide you with the care you need, when you need it.  We recommend signing up for the patient portal called "MyChart".  Sign up information is provided on this After Visit Summary.  MyChart is used to connect with patients for Virtual Visits (Telemedicine).  Patients are able to view lab/test results, encounter notes, upcoming appointments, etc.  Non-urgent messages can be sent to your provider as well.   To learn more about what you can do with MyChart, go to [email or web address].    Your next appointment:   1 year(s)  The format for your next appointment:   In Person  Provider:   Bernerd Pho, PA-C   Other Instructions Thank you for choosing [facility]!

## 2021-07-21 ENCOUNTER — Other Ambulatory Visit: Payer: Self-pay | Admitting: Internal Medicine

## 2021-07-21 DIAGNOSIS — F419 Anxiety disorder, unspecified: Secondary | ICD-10-CM

## 2021-07-31 ENCOUNTER — Ambulatory Visit: Payer: Medicare Other | Admitting: Internal Medicine

## 2021-08-10 IMAGING — CR DG CHEST 2V
2 series · 2 of 2 positions shown · non-contrast
Comparison: None

CLINICAL DATA: 64-year-old female with chest pain.

EXAM:
CHEST - 2 VIEW

[chest pa]
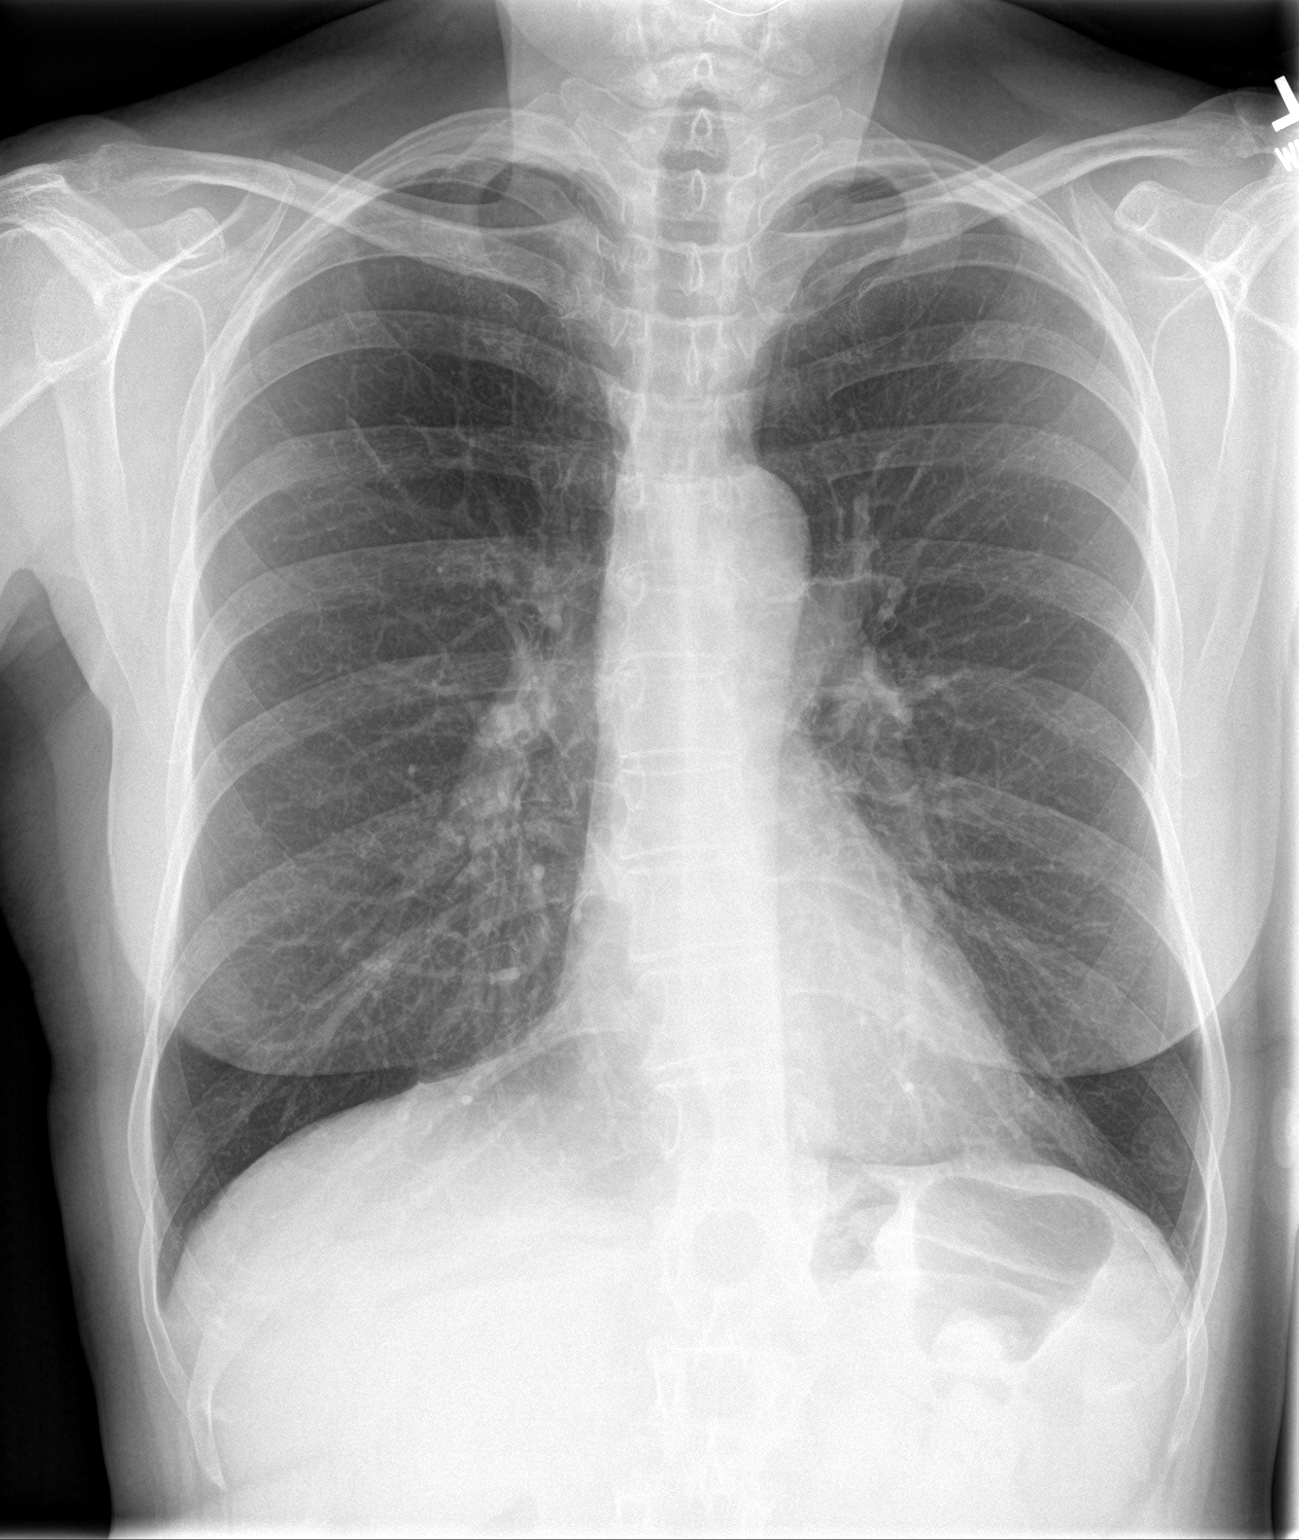

[chest lat]
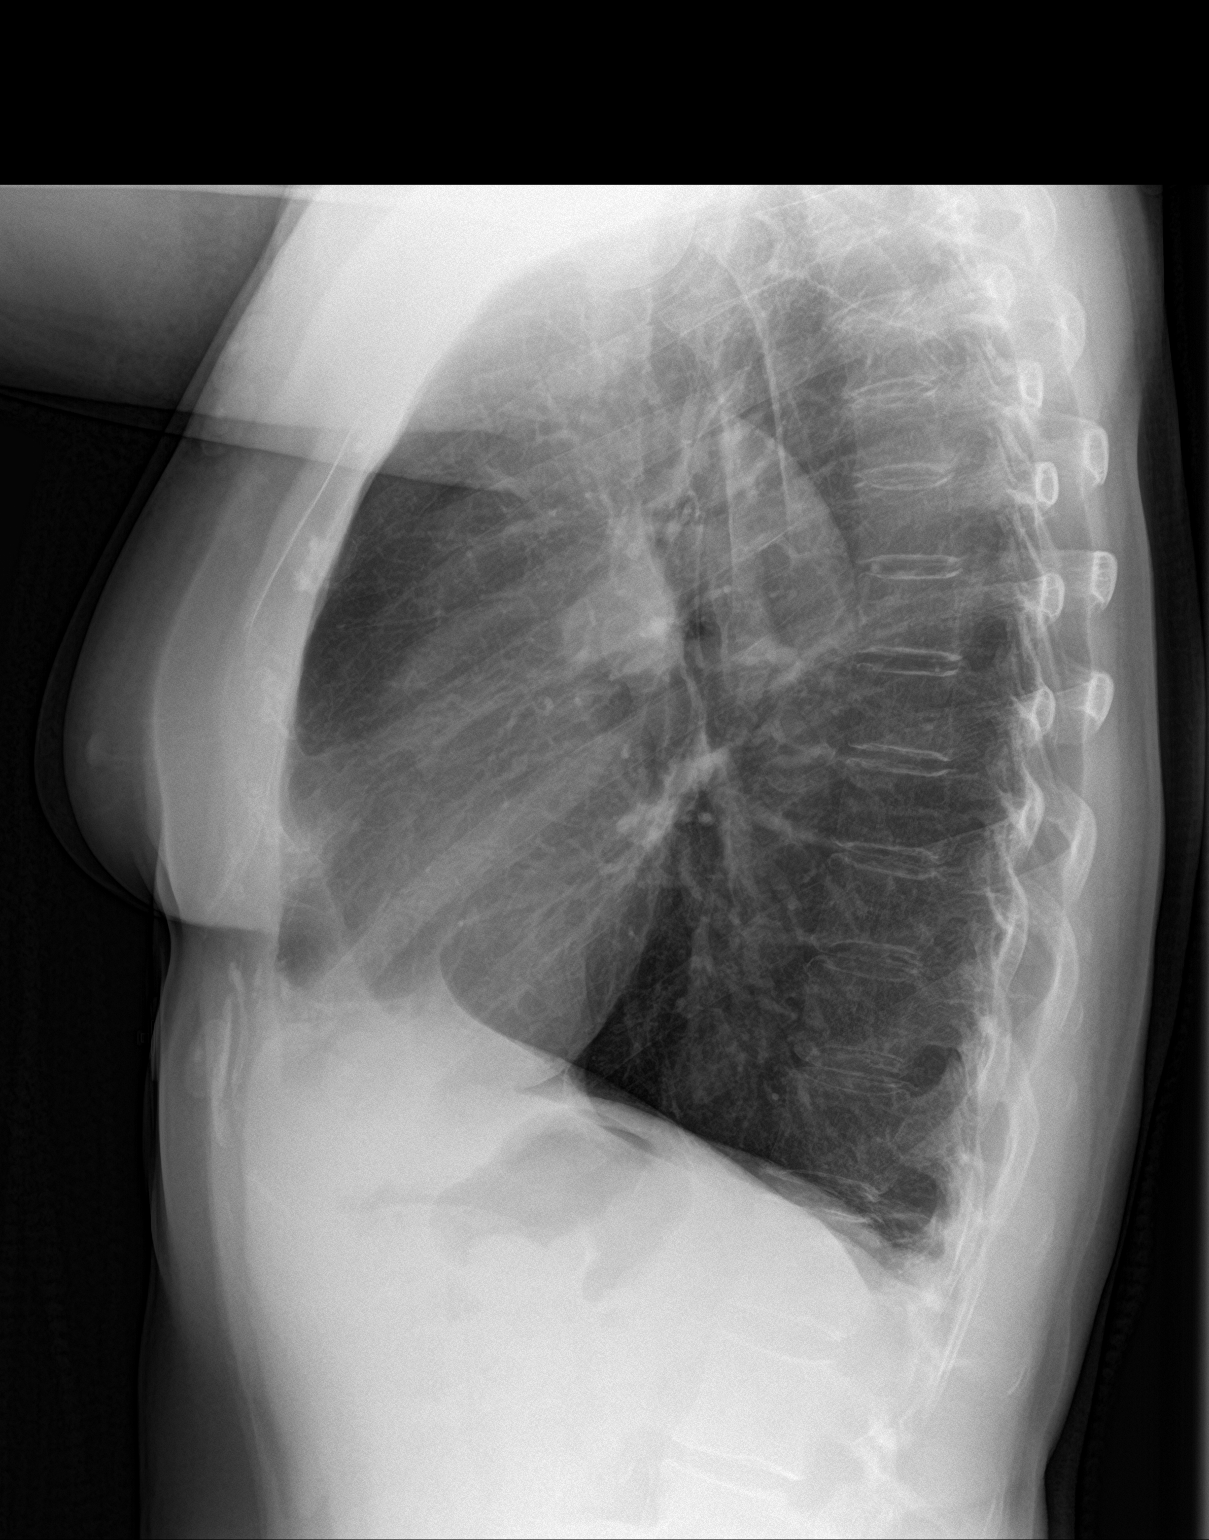

[2 of 2 positions shown; findings below may reference images not displayed]

FINDINGS: Hilar prominence is noted, most notably the RIGHT hilar region on
the LATERAL view

The cardiomediastinal silhouette is otherwise unremarkable.

There is no evidence of focal airspace disease, pulmonary edema,
suspicious pulmonary nodule/mass, pleural effusion, or pneumothorax.

No acute bony abnormalities are identified.
IMPRESSION: 1. Hilar prominence, notably on the LATERAL view. If no outside
prior studies are available for comparison to assess stability,
chest CT with contrast is recommended.

## 2021-08-10 IMAGING — CT CT CHEST W/ CM
2 of 3 series · 15 of 36 positions shown, 18 images · IV contrast (APPLIED)
Comparison: Chest x-ray from the same day

CLINICAL DATA: Lung opacities.

EXAM:
CT CHEST WITH CONTRAST
TECHNIQUE: Multidetector CT imaging of the chest was performed during
intravenous contrast administration.
CONTRAST:  75mL OMNIPAQUE IOHEXOL 300 MG/ML  SOLN

[Series 3: thorax 2.0 i31f 2 · axial · 0.68mm/px · z∈[+1180,+1478]mm · 12 of 175 slices shown, 15 images]
[im 13/175  mediastinal]
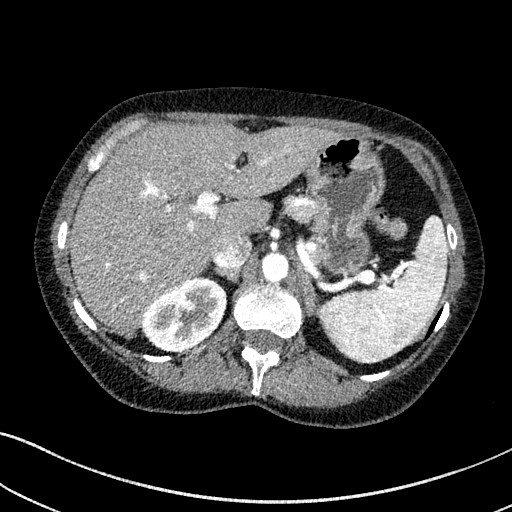
[im 13/175  lung]
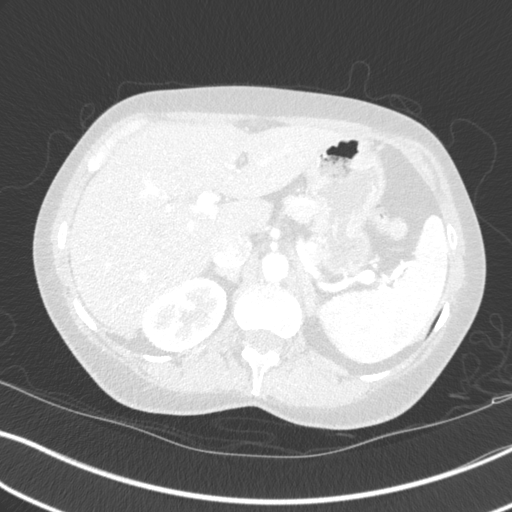
[im 26/175  lung]
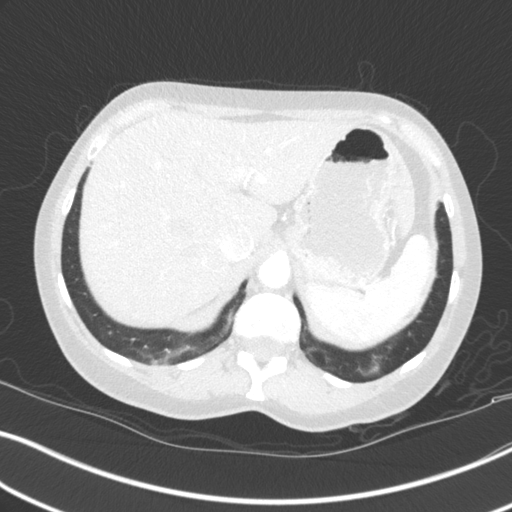
[im 39/175  lung]
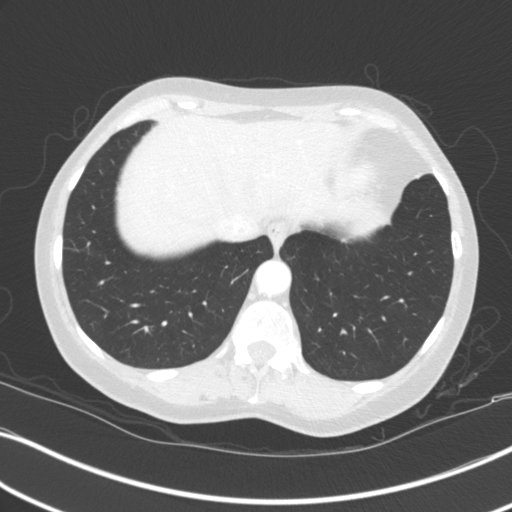
[im 52/175  lung]
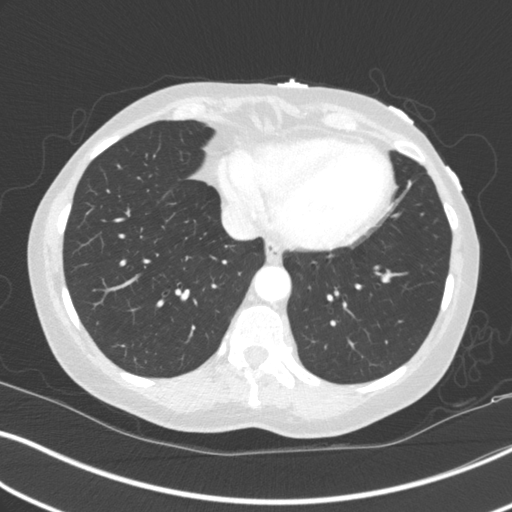
[im 65/175  mediastinal]
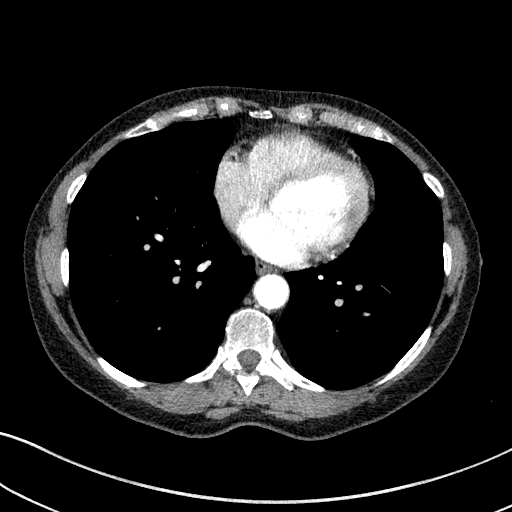
[im 65/175  lung]
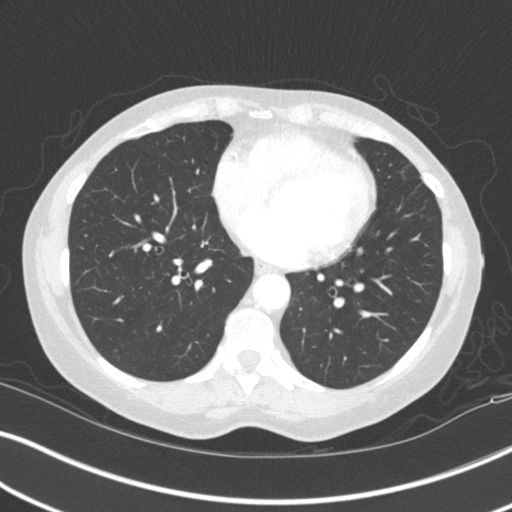
[im 78/175  lung]
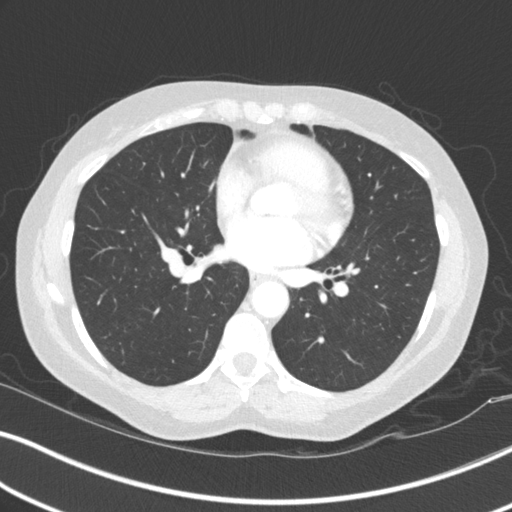
[im 97/175  lung]
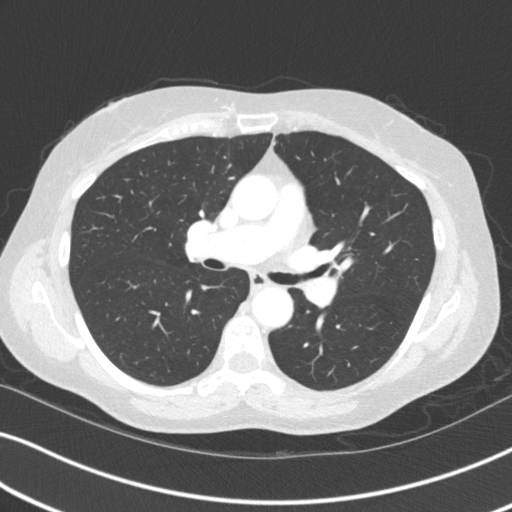
[im 110/175  lung]
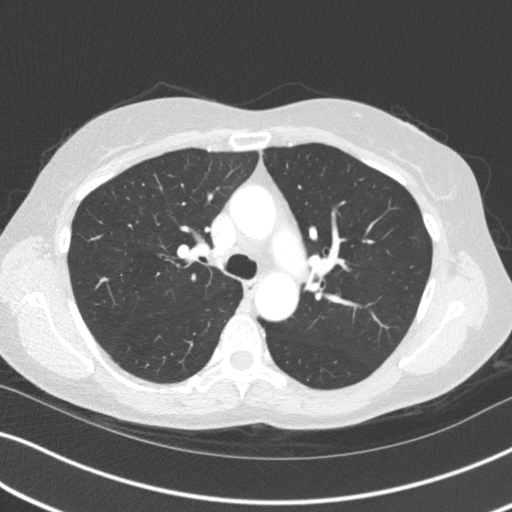
[im 123/175  mediastinal]
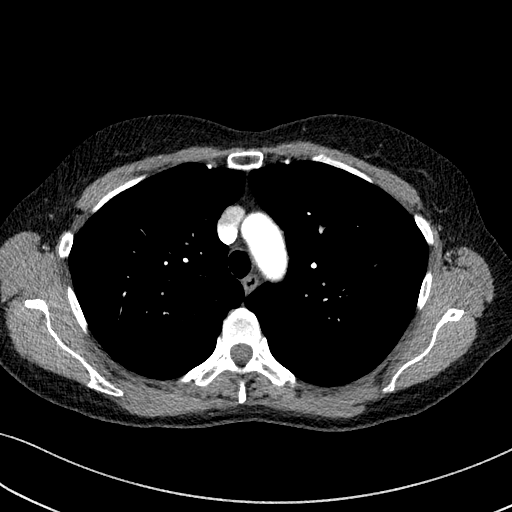
[im 123/175  lung]
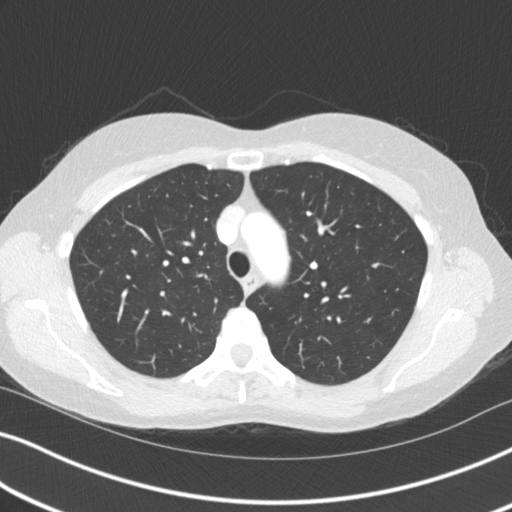
[im 136/175  lung]
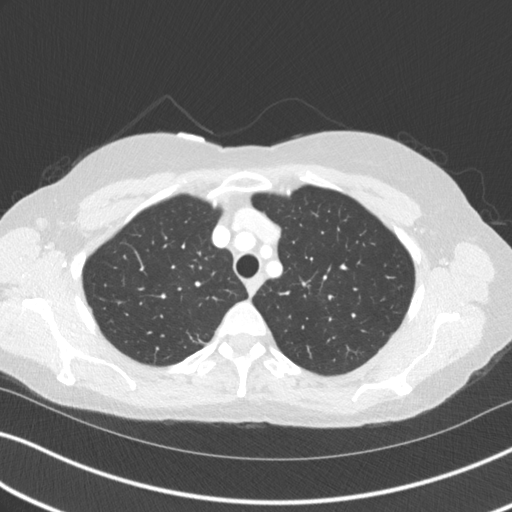
[im 149/175  lung]
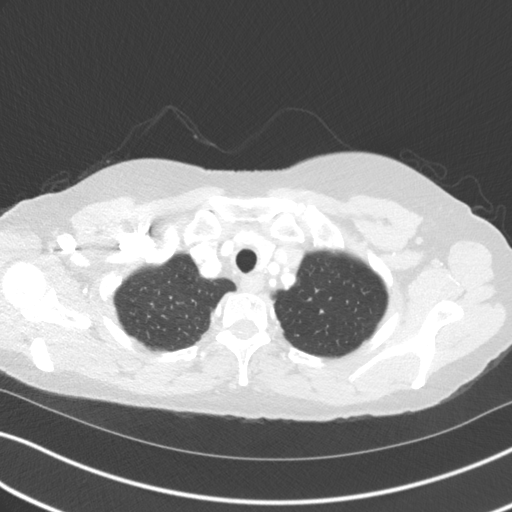
[im 162/175  lung]
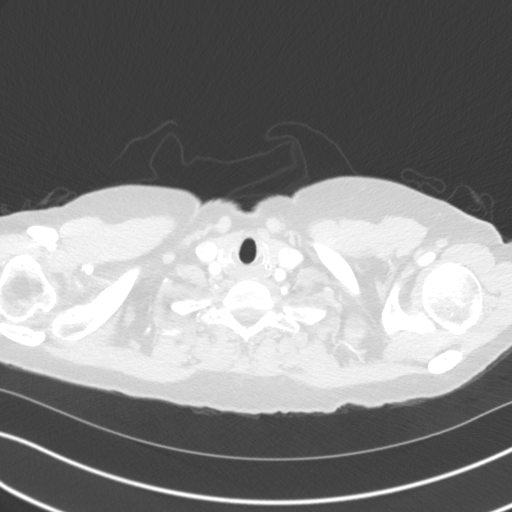

[Series 6: coronal · coronal · 0.68mm/px · 3 of 119 slices shown]
[im 24/119  lung]
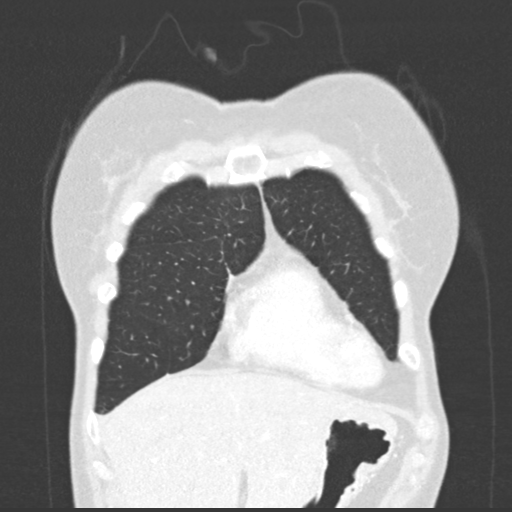
[im 48/119  lung]
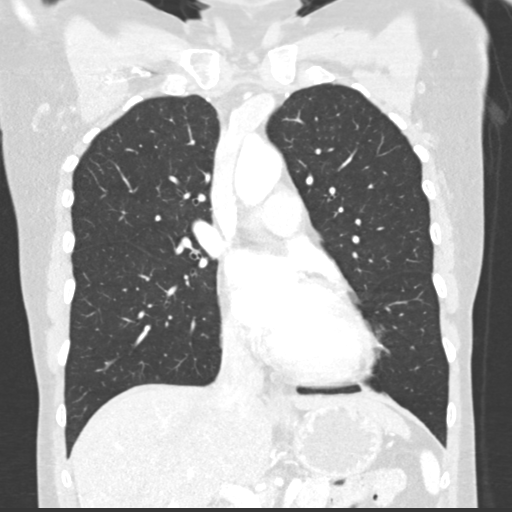
[im 71/119  lung]
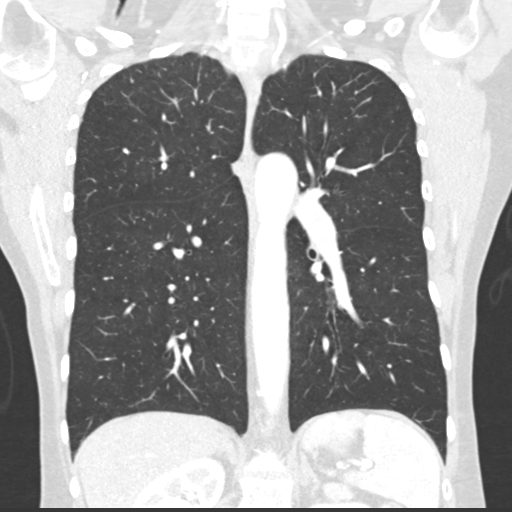

[15 of 36 positions shown; findings below may reference images not displayed]

FINDINGS: Cardiovascular: There is no evidence for thoracic aortic dissection
or aneurysm. The heart size is normal. There is no large centrally
located pulmonary embolism. No significant pericardial effusion. The
arch vessels are patent where visualized.

Mediastinum/Nodes:

-- No mediastinal lymphadenopathy.

-- No hilar lymphadenopathy.

-- No axillary lymphadenopathy.

-- No supraclavicular lymphadenopathy.

-- Normal thyroid gland where visualized.

-  Unremarkable esophagus.

Lungs/Pleura: Airways are patent. No pleural effusion, lobar
consolidation, pneumothorax or pulmonary infarction.

Upper Abdomen: Contrast bolus timing is not optimized for evaluation
of the abdominal organs. The visualized portions of the organs of
the upper abdomen are normal.

Musculoskeletal: No chest wall abnormality. No bony spinal canal
stenosis.

Review of the MIP images confirms the above findings.
IMPRESSION: No acute findings in the chest. The lungs are clear. No findings to
explain the patient's recent chest x-ray.

## 2022-01-19 ENCOUNTER — Other Ambulatory Visit: Payer: Self-pay | Admitting: Internal Medicine

## 2022-01-19 DIAGNOSIS — F419 Anxiety disorder, unspecified: Secondary | ICD-10-CM

## 2022-02-04 DIAGNOSIS — H43811 Vitreous degeneration, right eye: Secondary | ICD-10-CM | POA: Diagnosis not present

## 2022-02-04 DIAGNOSIS — H524 Presbyopia: Secondary | ICD-10-CM | POA: Diagnosis not present

## 2022-02-04 DIAGNOSIS — H2513 Age-related nuclear cataract, bilateral: Secondary | ICD-10-CM | POA: Diagnosis not present

## 2022-02-04 DIAGNOSIS — H40013 Open angle with borderline findings, low risk, bilateral: Secondary | ICD-10-CM | POA: Diagnosis not present

## 2022-02-27 DIAGNOSIS — Z1231 Encounter for screening mammogram for malignant neoplasm of breast: Secondary | ICD-10-CM | POA: Diagnosis not present

## 2022-02-27 DIAGNOSIS — Z01419 Encounter for gynecological examination (general) (routine) without abnormal findings: Secondary | ICD-10-CM | POA: Diagnosis not present

## 2022-02-27 DIAGNOSIS — Z1212 Encounter for screening for malignant neoplasm of rectum: Secondary | ICD-10-CM | POA: Diagnosis not present

## 2022-02-27 DIAGNOSIS — Z124 Encounter for screening for malignant neoplasm of cervix: Secondary | ICD-10-CM | POA: Diagnosis not present

## 2022-02-27 LAB — HM MAMMOGRAPHY

## 2022-07-25 ENCOUNTER — Other Ambulatory Visit: Payer: Self-pay | Admitting: Internal Medicine

## 2022-07-25 DIAGNOSIS — F419 Anxiety disorder, unspecified: Secondary | ICD-10-CM

## 2022-07-31 ENCOUNTER — Encounter: Payer: Self-pay | Admitting: Internal Medicine

## 2022-07-31 ENCOUNTER — Ambulatory Visit (INDEPENDENT_AMBULATORY_CARE_PROVIDER_SITE_OTHER): Payer: Medicare Other | Admitting: Internal Medicine

## 2022-07-31 VITALS — BP 156/92 | HR 61 | Ht 66.0 in | Wt 144.8 lb

## 2022-07-31 DIAGNOSIS — F5101 Primary insomnia: Secondary | ICD-10-CM | POA: Diagnosis not present

## 2022-07-31 DIAGNOSIS — I10 Essential (primary) hypertension: Secondary | ICD-10-CM | POA: Diagnosis not present

## 2022-07-31 DIAGNOSIS — E559 Vitamin D deficiency, unspecified: Secondary | ICD-10-CM | POA: Diagnosis not present

## 2022-07-31 DIAGNOSIS — G4733 Obstructive sleep apnea (adult) (pediatric): Secondary | ICD-10-CM

## 2022-07-31 DIAGNOSIS — E782 Mixed hyperlipidemia: Secondary | ICD-10-CM | POA: Diagnosis not present

## 2022-07-31 DIAGNOSIS — F419 Anxiety disorder, unspecified: Secondary | ICD-10-CM | POA: Diagnosis not present

## 2022-07-31 MED ORDER — TELMISARTAN 20 MG PO TABS: 20.0000 mg | ORAL_TABLET | Freq: Every day | ORAL | 3 refills | Status: DC

## 2022-07-31 MED ORDER — PAROXETINE HCL 10 MG PO TABS: 10.0000 mg | ORAL_TABLET | Freq: Every day | ORAL | 1 refills | Status: DC

## 2022-07-31 MED ORDER — TRAZODONE HCL 50 MG PO TABS
25.0000 mg | ORAL_TABLET | Freq: Every evening | ORAL | 3 refills | Status: DC | PRN
Start: 2022-07-31 — End: 2022-08-26

## 2022-07-31 NOTE — Assessment & Plan Note (Signed)
BP Readings from Last 1 Encounters:  07/31/22 (!) 156/92   New onset Started Telmisartan 20 mg QD Check CMP after 2 weeks Counseled for compliance with the medications Advised DASH diet and moderate exercise/walking, at least 150 mins/week

## 2022-07-31 NOTE — Assessment & Plan Note (Signed)
Uses snore guard Used to have CPAP, did not tolerate. Referred to Pulmonology for [facility] evaluation

## 2022-07-31 NOTE — Assessment & Plan Note (Signed)
Well-controlled with Paxil Has insomnia, advised to take Paxil in AM

## 2022-07-31 NOTE — Patient Instructions (Signed)
Please start taking Telmisartan 20 mg as prescribed.  Please take Trazodone as needed for insomnia.  Please maintain simple sleep hygiene. - Maintain dark and non-noisy environment in the bedroom. - Please use the bedroom for sleep and sexual activity only. - Do not use electronic devices in the bedroom. - Please take dinner at least 2 hours before bedtime. - Please avoid caffeinated products in the evening, including coffee, soft drinks. - Please try to maintain the regular sleep-wake cycle - Go to bed and wake up at the same time.  Please follow DASH diet and perform moderate exercise/walking at least 150 mins/week.

## 2022-07-31 NOTE — Progress Notes (Signed)
Established Patient Office Visit  Subjective:  Patient ID: Theresa Matthews, female    DOB: Nov 20, 1955  Age: 66 y.o. MRN: [id number]  CC:  Chief Complaint  Patient presents with   Follow-up    Pt wanted to discuss bp also sleep is not great heard paxil can cause this    HPI Theresa Matthews is a 66 y.o. female with past medical history of anxiety and HLD who presents for f/u of her chronic medical conditions.  GAD: Currently well-controlled with Paxil. She c/o chronic insomnia and asks if it is related to Paxil, which she takes at bedtime. She denies anhedonia, SI or HI.  HTN: Her BP was elevated today. She reports high BP readings at home as well, ~ 140s/80s. She denies headache, dizziness, chest pain, dyspnea or palpitations.  OSA: Did not tolerate CPAP in the past. Had sleep study in [place].      Past Medical History:  Diagnosis Date   Anxiety    Chest pain 12/10/2020    Past Surgical History:  Procedure Laterality Date   APPENDECTOMY      Family History  Problem Relation Age of Onset   Hypertension Mother    Hypothyroidism Mother    Heart disease Father    Bladder Cancer Father    Brain cancer Brother     Social History   Socioeconomic History   Marital status: Married    Spouse name: Not on file   Number of children: Not on file   Years of education: Not on file   Highest education level: Not on file  Occupational History   Occupation: RN  Tobacco Use   Smoking status: Never   Smokeless tobacco: Never  Vaping Use   Vaping Use: Never used  Substance and Sexual Activity   Alcohol use: Not Currently   Drug use: Never   Sexual activity: Yes    Partners: Male  Other Topics Concern   Not on file  Social History Narrative   Is a fraternal twin. Grew up in [place].    Married, married for over 98 years.   4 children.    Lives in [place], [place].   Eats all food groups.   Wear seatbelt.   Exercise, hike trails, ride bikes.   Social Determinants  of Health   Financial Resource Strain: Low Risk  (03/02/2018)   Overall Financial Resource Strain (CARDIA)    Difficulty of Paying Living Expenses: Not hard at all  Food Insecurity: No Food Insecurity (03/02/2018)   Hunger Vital Sign    Worried About Running Out of Food in the Last Year: Never true    Ran Out of Food in the Last Year: Never true  Transportation Needs: No Transportation Needs (03/02/2018)   PRAPARE - Hydrologist (Medical): No    Lack of Transportation (Non-Medical): No  Physical Activity: Sufficiently Active (03/02/2018)   Exercise Vital Sign    Days of Exercise per Week: 4 days    Minutes of Exercise per Session: 60 min  Stress: No Stress Concern Present (03/02/2018)   [place]    Feeling of Stress : Only a little  Social Connections: Socially Integrated (03/02/2018)   Social Connection and Isolation Panel [NHANES]    Frequency of Communication with Friends and Family: More than three times a week    Frequency of Social Gatherings with Friends and Family: Three times a week    Attends Religious  Services: More than 4 times per year    Active Member of Clubs or Organizations: Yes    Attends Archivist Meetings: More than 4 times per year    Marital Status: Married  Human resources officer Violence: Not At Risk (03/02/2018)   Humiliation, Afraid, Rape, and Kick questionnaire    Fear of Current or Ex-Partner: No    Emotionally Abused: No    Physically Abused: No    Sexually Abused: No    Outpatient Medications Prior to Visit  Medication Sig Dispense Refill   PARoxetine (PAXIL) 10 MG tablet TAKE 1 TABLET BY MOUTH EVERYDAY AT BEDTIME 90 tablet 1   No facility-administered medications prior to visit.    No Known Allergies  ROS Review of Systems  Constitutional:  Negative for chills and fever.  HENT:  Negative for congestion, sinus pressure, sinus pain and sore throat.    Eyes:  Negative for pain and discharge.  Respiratory:  Negative for cough and shortness of breath.   Cardiovascular:  Negative for chest pain and palpitations.  Gastrointestinal:  Negative for abdominal pain, constipation, diarrhea, nausea and vomiting.  Endocrine: Negative for polydipsia and polyuria.  Genitourinary:  Negative for dysuria and hematuria.  Musculoskeletal:  Negative for neck pain and neck stiffness.  Skin:  Negative for rash.  Neurological:  Negative for dizziness and weakness.  Psychiatric/Behavioral:  Negative for agitation and behavioral problems.       Objective:    Physical Exam Vitals reviewed.  Constitutional:      General: She is not in acute distress.    Appearance: She is not diaphoretic.  HENT:     Head: Normocephalic and atraumatic.     Mouth/Throat:     Mouth: Mucous membranes are moist.  Eyes:     General: No scleral icterus.    Extraocular Movements: Extraocular movements intact.  Cardiovascular:     Rate and Rhythm: Normal rate and regular rhythm.     Heart sounds: Normal heart sounds. No murmur heard. Pulmonary:     Breath sounds: Normal breath sounds. No wheezing or rales.  Musculoskeletal:     Cervical back: Neck supple. No tenderness.     Right lower leg: No edema.     Left lower leg: No edema.  Skin:    General: Skin is warm.     Findings: No rash.  Neurological:     General: No focal deficit present.     Mental Status: She is alert and oriented to person, place, and time.     Cranial Nerves: No cranial nerve deficit.     Sensory: No sensory deficit.     Motor: No weakness.  Psychiatric:        Mood and Affect: Mood normal.        Behavior: Behavior normal.     BP (!) 156/92 (BP Location: Right Arm)   Pulse 61   Ht 5' 6"  (1.676 m)   Wt 144 lb 12.8 oz (65.7 kg)   SpO2 99%   BMI 23.37 kg/m  Wt Readings from Last 3 Encounters:  07/31/22 144 lb 12.8 oz (65.7 kg)  01/25/21 152 lb (68.9 kg)  01/25/21 152 lb (68.9 kg)     Lab Results  Component Value Date   TSH 2.497 12/11/2020   Lab Results  Component Value Date   WBC 5.4 12/11/2020   HGB 14.6 12/11/2020   HCT 43.2 12/11/2020   MCV 93.5 12/11/2020   PLT 295 12/11/2020   Lab Results  Component Value Date   NA 142 12/11/2020   K 3.5 12/11/2020   CO2 25 12/11/2020   GLUCOSE 100 (H) 12/11/2020   BUN 11 12/11/2020   CREATININE 0.73 12/11/2020   BILITOT 0.6 12/11/2020   ALKPHOS 47 12/11/2020   AST 20 12/11/2020   ALT 15 12/11/2020   PROT 6.5 12/11/2020   ALBUMIN 3.8 12/11/2020   CALCIUM 9.4 12/11/2020   ANIONGAP 14 12/11/2020   Lab Results  Component Value Date   CHOL 234 (H) 12/11/2020   Lab Results  Component Value Date   HDL 72 12/11/2020   Lab Results  Component Value Date   LDLCALC 135 (H) 12/11/2020   Lab Results  Component Value Date   TRIG 135 12/11/2020   Lab Results  Component Value Date   CHOLHDL 3.3 12/11/2020   Lab Results  Component Value Date   HGBA1C 5.4 12/11/2020      Assessment & Plan:   Problem List Items Addressed This Visit       Cardiovascular and Mediastinum   Essential hypertension - Primary    BP Readings from Last 1 Encounters:  07/31/22 (!) 156/92  New onset Started Telmisartan 20 mg QD Check CMP after 2 weeks Counseled for compliance with the medications Advised DASH diet and moderate exercise/walking, at least 150 mins/week      Relevant Medications   telmisartan (MICARDIS) 20 MG tablet   Other Relevant Orders   TSH   CMP14+EGFR   CBC with Differential/Platelet     Respiratory   OSA (obstructive sleep apnea)    Uses snore guard Used to have CPAP, did not tolerate. Referred to Pulmonology for [facility] evaluation      Relevant Orders   Ambulatory referral to Pulmonology   TSH   CMP14+EGFR   CBC with Differential/Platelet     Other   Anxiety    Well-controlled with Paxil Has insomnia, advised to take Paxil in AM      Relevant Medications   traZODone (DESYREL)  50 MG tablet   PARoxetine (PAXIL) 10 MG tablet   Hyperlipidemia    Check lipid profile Advised to follow DASH diet      Relevant Medications   telmisartan (MICARDIS) 20 MG tablet   Other Relevant Orders   Lipid panel   Primary insomnia    Added Trazodone PRN Sleep hygiene material provided      Relevant Medications   traZODone (DESYREL) 50 MG tablet   Other Visit Diagnoses     Vitamin D deficiency       Relevant Orders   VITAMIN D 25 Hydroxy (Vit-D Deficiency, Fractures)       Meds ordered this encounter  Medications   traZODone (DESYREL) 50 MG tablet    Sig: Take 0.5-1 tablets (25-50 mg total) by mouth at bedtime as needed for sleep.    Dispense:  30 tablet    Refill:  3   PARoxetine (PAXIL) 10 MG tablet    Sig: Take 1 tablet (10 mg total) by mouth daily.    Dispense:  90 tablet    Refill:  1   telmisartan (MICARDIS) 20 MG tablet    Sig: Take 1 tablet (20 mg total) by mouth daily.    Dispense:  30 tablet    Refill:  3    Follow-up: Return in about 6 weeks (around 09/11/2022) for HTN.    Lindell Spar, MD

## 2022-07-31 NOTE — Assessment & Plan Note (Signed)
Added Trazodone PRN Sleep hygiene material provided

## 2022-07-31 NOTE — Assessment & Plan Note (Signed)
Check lipid profile Advised to follow DASH diet

## 2022-08-06 ENCOUNTER — Encounter: Payer: Medicare Other | Admitting: Internal Medicine

## 2022-08-06 ENCOUNTER — Ambulatory Visit: Payer: Medicare Other | Admitting: Internal Medicine

## 2022-08-23 ENCOUNTER — Other Ambulatory Visit: Payer: Self-pay | Admitting: Internal Medicine

## 2022-08-23 DIAGNOSIS — F5101 Primary insomnia: Secondary | ICD-10-CM

## 2022-08-26 ENCOUNTER — Ambulatory Visit (INDEPENDENT_AMBULATORY_CARE_PROVIDER_SITE_OTHER): Payer: Medicare Other | Admitting: Adult Health

## 2022-08-26 ENCOUNTER — Encounter: Payer: Self-pay | Admitting: Adult Health

## 2022-08-26 ENCOUNTER — Ambulatory Visit: Payer: Medicare Other | Admitting: Internal Medicine

## 2022-08-26 VITALS — BP 130/84 | HR 77 | Ht 66.0 in | Wt 143.6 lb

## 2022-08-26 DIAGNOSIS — F5101 Primary insomnia: Secondary | ICD-10-CM

## 2022-08-26 DIAGNOSIS — G4733 Obstructive sleep apnea (adult) (pediatric): Secondary | ICD-10-CM | POA: Diagnosis not present

## 2022-08-26 NOTE — Patient Instructions (Signed)
Set up for home sleep study  Healthy sleep regimen  Do not drive if sleepy Follow up in 2-3 months to discuss results and treatment plans

## 2022-08-26 NOTE — Assessment & Plan Note (Signed)
Healthy sleep regimen discussed in detail.

## 2022-08-26 NOTE — Assessment & Plan Note (Signed)
Previous diagnosis of OSA , restless sleep and snoring all concerning for ongoing sleep apnea. Will set patient up for home sleep study. Patient education on sleep apnea given.  - discussed how weight can impact sleep and risk for sleep disordered breathing - discussed options to assist with weight loss: combination of diet modification, cardiovascular and strength training exercises   - had an extensive discussion regarding the adverse health consequences related to untreated sleep disordered breathing - specifically discussed the risks for hypertension, coronary artery disease, cardiac dysrhythmias, cerebrovascular disease, and diabetes - lifestyle modification discussed   - discussed how sleep disruption can increase risk of accidents, particularly when driving - safe driving practices were discussed   Plan  Patient Instructions  Set up for home sleep study  Healthy sleep regimen  Do not drive if sleepy Follow up in 2-3 months to discuss results and treatment plans

## 2022-08-26 NOTE — Progress Notes (Signed)
$'@Patient'Z$  ID: Theresa Matthews, female    DOB: 1956-02-07, 66 y.o.   MRN: [id number]  Chief Complaint  Patient presents with   Consult    Referring provider: Lindell Spar, MD  HPI: 66 year old female seen for sleep consult August 26, 2022 for insomnia, restless sleep, daytime sleepiness and snoring. Previous diagnosis of OSA in 2014.   TEST/EVENTS :   08/26/2022 Sleep consult  Patient presents for sleep consult today.  Kindly referred by primary care provider Dr. Posey Pronto.  Patient was diagnosed with OSA in [place]. Was started on CPAP in 2014, wore for ~ 7 yrs . Stopped due to uncomfortable with mask and pressures.  Used otc mouthguard for snoring but stopped using Patient complains of very restless sleep waking up frequently at night, daytime sleepiness snoring and daytime fatigue.  Typically goes to bed about 10 PM.  Takes only a few minutes to go to sleep.  Is up 3 times each night.  Gets up between 530 and 6:30 AM.  Patient's weight is down about 5 pounds.  Current weight is at 143 pounds with a BMI at 23.  Has a previous diagnosis of sleep apnea at sleep study done in 2014.   No symptoms suspicious for cataplexy or sleep paralysis.  No removable dental work.  Caffeine intake 2 cups of coffee.  No history of congestive heart failure or stroke Does not sleep in bedroom with spouse due to each other snoring.  Weight is similar to 2014.  Recently started on Trazodone for insomnia. Felt it did not work . She is not longer taking.    Past Surgical History:  Procedure Laterality Date   APPENDECTOMY     NOSE SURGERY    Tonsillectomy    No Known Allergies  Immunization History  Administered Date(s) Administered   Moderna Sars-Covid-2 Vaccination 09/21/2019, 10/22/2019    Past Medical History:  Diagnosis Date   Anxiety    Chest pain 12/10/2020    Tobacco History: Social History   Tobacco Use  Smoking Status Never  Smokeless Tobacco Never   Counseling given: Not  Answered   Outpatient Medications Prior to Visit  Medication Sig Dispense Refill   PARoxetine (PAXIL) 10 MG tablet Take 1 tablet (10 mg total) by mouth daily. 90 tablet 1   telmisartan (MICARDIS) 20 MG tablet Take 1 tablet (20 mg total) by mouth daily. 30 tablet 3   traZODone (DESYREL) 50 MG tablet TAKE 1/2 TO 1 TABLET BY MOUTH AT BEDTIME AS NEEDED FOR SLEEP. (Patient not taking: Reported on 08/26/2022) 90 tablet 2   No facility-administered medications prior to visit.     Review of Systems:   Constitutional:   No  weight loss, night sweats,  Fevers, chills,  +fatigue, or  lassitude.  HEENT:   No headaches,  Difficulty swallowing,  Tooth/dental problems, or  Sore throat,                No sneezing, itching, ear ache, nasal congestion, post nasal drip,   CV:  No chest pain,  Orthopnea, PND, swelling in lower extremities, anasarca, dizziness, palpitations, syncope.   GI  No heartburn, indigestion, abdominal pain, nausea, vomiting, diarrhea, change in bowel habits, loss of appetite, bloody stools.   Resp: No shortness of breath with exertion or at rest.  No excess mucus, no productive cough,  No non-productive cough,  No coughing up of blood.  No change in color of mucus.  No wheezing.  No chest wall deformity  Skin: no rash or lesions.  GU: no dysuria, change in color of urine, no urgency or frequency.  No flank pain, no hematuria   MS:  No joint pain or swelling.  No decreased range of motion.  No back pain.    Physical Exam  BP 130/84 (BP Location: Right Arm, Cuff Size: Normal)   Pulse 77   Ht '5\' 6"'$  (1.676 m)   Wt 143 lb 9.6 oz (65.1 kg)   SpO2 98%   BMI 23.18 kg/m   GEN: A/Ox3; pleasant , NAD, well nourished    HEENT:  [place]/AT, NOSE-clear, THROAT-clear, no lesions, no postnasal drip or exudate noted. Class 2-3 MP airway   NECK:  Supple w/ fair ROM; no JVD; normal carotid impulses w/o bruits; no thyromegaly or nodules palpated; no lymphadenopathy.    RESP  Clear  P &  A; w/o, wheezes/ rales/ or rhonchi. no accessory muscle use, no dullness to percussion  CARD:  RRR, no m/r/g, no peripheral edema, pulses intact, no cyanosis or clubbing.  GI:   Soft & nt; nml bowel sounds; no organomegaly or masses detected.   Musco: Warm bil, no deformities or joint swelling noted.   Neuro: alert, no focal deficits noted.    Skin: Warm, no lesions or rashes    Lab Results:  CBC   BNP No results found for: "BNP"  ProBNP No results found for: "PROBNP"  Imaging: No results found.        No data to display          No results found for: "NITRICOXIDE"      Assessment & Plan:   OSA (obstructive sleep apnea) Previous diagnosis of OSA , restless sleep and snoring all concerning for ongoing sleep apnea. Will set patient up for home sleep study. Patient education on sleep apnea given.  - discussed how weight can impact sleep and risk for sleep disordered breathing - discussed options to assist with weight loss: combination of diet modification, cardiovascular and strength training exercises   - had an extensive discussion regarding the adverse health consequences related to untreated sleep disordered breathing - specifically discussed the risks for hypertension, coronary artery disease, cardiac dysrhythmias, cerebrovascular disease, and diabetes - lifestyle modification discussed   - discussed how sleep disruption can increase risk of accidents, particularly when driving - safe driving practices were discussed   Plan  Patient Instructions  Set up for home sleep study  Healthy sleep regimen  Do not drive if sleepy Follow up in 2-3 months to discuss results and treatment plans      Primary insomnia Healthy sleep regimen discussed in detail.     [name], NP 08/26/2022

## 2022-08-27 ENCOUNTER — Other Ambulatory Visit: Payer: Self-pay | Admitting: Internal Medicine

## 2022-08-27 DIAGNOSIS — F5101 Primary insomnia: Secondary | ICD-10-CM

## 2022-09-10 ENCOUNTER — Encounter (INDEPENDENT_AMBULATORY_CARE_PROVIDER_SITE_OTHER): Payer: Medicare Other

## 2022-09-10 DIAGNOSIS — G4733 Obstructive sleep apnea (adult) (pediatric): Secondary | ICD-10-CM

## 2022-09-18 DIAGNOSIS — L814 Other melanin hyperpigmentation: Secondary | ICD-10-CM | POA: Diagnosis not present

## 2022-09-18 DIAGNOSIS — E782 Mixed hyperlipidemia: Secondary | ICD-10-CM | POA: Diagnosis not present

## 2022-09-18 DIAGNOSIS — E559 Vitamin D deficiency, unspecified: Secondary | ICD-10-CM | POA: Diagnosis not present

## 2022-09-18 DIAGNOSIS — G4733 Obstructive sleep apnea (adult) (pediatric): Secondary | ICD-10-CM | POA: Diagnosis not present

## 2022-09-18 DIAGNOSIS — L821 Other seborrheic keratosis: Secondary | ICD-10-CM | POA: Diagnosis not present

## 2022-09-18 DIAGNOSIS — I10 Essential (primary) hypertension: Secondary | ICD-10-CM | POA: Diagnosis not present

## 2022-09-18 DIAGNOSIS — D1801 Hemangioma of skin and subcutaneous tissue: Secondary | ICD-10-CM | POA: Diagnosis not present

## 2022-09-18 DIAGNOSIS — Z1283 Encounter for screening for malignant neoplasm of skin: Secondary | ICD-10-CM | POA: Diagnosis not present

## 2022-09-19 LAB — CBC WITH DIFFERENTIAL/PLATELET
Basophils Absolute: 0.1 10*3/uL (ref 0.0–0.2)
Basos: 1 %
EOS (ABSOLUTE): 0.1 10*3/uL (ref 0.0–0.4)
Eos: 1 %
Hematocrit: 44.5 % (ref 34.0–46.6)
Hemoglobin: 14.7 g/dL (ref 11.1–15.9)
Immature Grans (Abs): 0 10*3/uL (ref 0.0–0.1)
Immature Granulocytes: 0 %
Lymphocytes Absolute: 0.7 10*3/uL (ref 0.7–3.1)
Lymphs: 13 %
MCH: 30.9 pg (ref 26.6–33.0)
MCHC: 33 g/dL (ref 31.5–35.7)
MCV: 94 fL (ref 79–97)
Monocytes Absolute: 0.5 10*3/uL (ref 0.1–0.9)
Monocytes: 11 %
Neutrophils Absolute: 3.8 10*3/uL (ref 1.4–7.0)
Neutrophils: 74 %
Platelets: 286 10*3/uL (ref 150–450)
RBC: 4.76 x10E6/uL (ref 3.77–5.28)
RDW: 12.1 % (ref 11.7–15.4)
WBC: 5.1 10*3/uL (ref 3.4–10.8)

## 2022-09-19 LAB — LIPID PANEL
Chol/HDL Ratio: 3.1 ratio (ref 0.0–4.4)
Cholesterol, Total: 235 mg/dL — ABNORMAL HIGH (ref 100–199)
HDL: 77 mg/dL (ref >39)
LDL Chol Calc (NIH): 147 mg/dL — ABNORMAL HIGH (ref 0–99)
Triglycerides: 67 mg/dL (ref 0–149)
VLDL Cholesterol Cal: 11 mg/dL (ref 5–40)

## 2022-09-19 LAB — VITAMIN D 25 HYDROXY (VIT D DEFICIENCY, FRACTURES): Vit D, 25-Hydroxy: 53.3 ng/mL (ref 30.0–100.0)

## 2022-09-19 LAB — CMP14+EGFR
ALT: 20 IU/L (ref 0–32)
AST: 22 IU/L (ref 0–40)
Albumin/Globulin Ratio: 2.1 (ref 1.2–2.2)
Albumin: 4.4 g/dL (ref 3.9–4.9)
Alkaline Phosphatase: 64 IU/L (ref 44–121)
BUN/Creatinine Ratio: 22 (ref 12–28)
BUN: 16 mg/dL (ref 8–27)
Bilirubin Total: 0.5 mg/dL (ref 0.0–1.2)
CO2: 28 mmol/L (ref 20–29)
Calcium: 9.5 mg/dL (ref 8.7–10.3)
Chloride: 103 mmol/L (ref 96–106)
Creatinine, Ser: 0.73 mg/dL (ref 0.57–1.00)
Globulin, Total: 2.1 g/dL (ref 1.5–4.5)
Glucose: 91 mg/dL (ref 70–99)
Potassium: 4.4 mmol/L (ref 3.5–5.2)
Sodium: 143 mmol/L (ref 134–144)
Total Protein: 6.5 g/dL (ref 6.0–8.5)
eGFR: 90 mL/min/{1.73_m2} (ref >59)

## 2022-09-19 LAB — TSH: TSH: 1.6 u[IU]/mL (ref 0.450–4.500)

## 2022-09-24 NOTE — Progress Notes (Signed)
Returned call..thc 

## 2022-09-25 ENCOUNTER — Ambulatory Visit (INDEPENDENT_AMBULATORY_CARE_PROVIDER_SITE_OTHER): Payer: Medicare Other | Admitting: Internal Medicine

## 2022-09-25 ENCOUNTER — Encounter: Payer: Self-pay | Admitting: Internal Medicine

## 2022-09-25 VITALS — BP 150/70

## 2022-09-25 DIAGNOSIS — I10 Essential (primary) hypertension: Secondary | ICD-10-CM | POA: Diagnosis not present

## 2022-09-25 MED ORDER — TELMISARTAN 40 MG PO TABS: 40.0000 mg | ORAL_TABLET | Freq: Every day | ORAL | 3 refills | Status: DC

## 2022-09-25 NOTE — Assessment & Plan Note (Signed)
BP Readings from Last 1 Encounters:  09/25/22 (!) 150/70   Uncontrolled with Telmisartan 20 mg QD Increased dose of telmisartan to 40 mg daily Check CMP after 2 weeks Counseled for compliance with the medications Advised DASH diet and moderate exercise/walking, at least 150 mins/week

## 2022-09-25 NOTE — Patient Instructions (Addendum)
Please start taking Telmisartan 40 mg instead of 20 mg.  Please continue to follow DASH diet and perform moderate exercise/walking at least 150 minutes/week.

## 2022-09-25 NOTE — Progress Notes (Signed)
Virtual Visit via Telephone Note   This visit type was conducted via telephone. This format is felt to be most appropriate for this patient at this time.  The patient did not have access to video technology/had technical difficulties with video requiring transitioning to audio format only (telephone).  All issues noted in this document were discussed and addressed.  No physical exam could be performed with this format.  Evaluation Performed:  Follow-up visit  Date:  09/25/2022   ID:  Theresa Matthews, DOB 1956/02/07, MRN [id number]  Patient Location: Home Provider Location: Office/Clinic  Participants: Patient Location of Patient: Home Location of Provider: Telehealth Consent was obtain for visit to be over via telehealth. I verified that I am speaking with the correct person using two identifiers.  PCP:  Lindell Spar, MD   Chief Complaint: HTN  History of Present Illness:    Theresa Matthews is a 66 y.o. female with PMH of HTN, anxiety and HLD who has a televisit for follow-up of her HTN.  She was recently placed on telmisartan 20 mg daily.  She reports that her BP still remains in the 140s/80s at home and at her workplace.  She denies any headache, dizziness, chest pain, dyspnea or palpitations.  The patient does not have symptoms concerning for COVID-19 infection (fever, chills, cough, or new shortness of breath).   Past Medical, Surgical, Social History, Allergies, and Medications have been Reviewed.  Past Medical History:  Diagnosis Date   Anxiety    Chest pain 12/10/2020   Past Surgical History:  Procedure Laterality Date   APPENDECTOMY     NOSE SURGERY       No outpatient medications have been marked as taking for the 09/25/22 encounter (Office Visit) with Lindell Spar, MD.     Allergies:   Patient has no known allergies.   ROS:   Please see the history of present illness.     All other systems reviewed and are negative.   Labs/Other Tests and Data  Reviewed:    Recent Labs: 09/18/2022: ALT 20; BUN 16; Creatinine, Ser 0.73; Hemoglobin 14.7; Platelets 286; Potassium 4.4; Sodium 143; TSH 1.600   Recent Lipid Panel Lab Results  Component Value Date/Time   CHOL 235 (H) 09/18/2022 09:08 AM   TRIG 67 09/18/2022 09:08 AM   HDL 77 09/18/2022 09:08 AM   CHOLHDL 3.1 09/18/2022 09:08 AM   CHOLHDL 3.3 12/11/2020 02:44 AM   LDLCALC 147 (H) 09/18/2022 09:08 AM   LDLCALC 136 (H) 03/02/2018 09:27 AM    Wt Readings from Last 3 Encounters:  08/26/22 143 lb 9.6 oz (65.1 kg)  07/31/22 144 lb 12.8 oz (65.7 kg)  01/25/21 152 lb (68.9 kg)     ASSESSMENT & PLAN:    Essential hypertension BP Readings from Last 1 Encounters:  09/25/22 (!) 150/70   Uncontrolled with Telmisartan 20 mg QD Increased dose of telmisartan to 40 mg daily Check CMP after 2 weeks Counseled for compliance with the medications Advised DASH diet and moderate exercise/walking, at least 150 mins/week   Time:   Today, I have spent 11 minutes reviewing the chart, including problem list, medications, and with the patient with telehealth technology discussing the above problems.   Medication Adjustments/Labs and Tests Ordered: Current medicines are reviewed at length with the patient today.  Concerns regarding medicines are outlined above.   Tests Ordered: No orders of the defined types were placed in this encounter.   Medication Changes: No orders of  the defined types were placed in this encounter.    Note: This dictation was prepared with Dragon dictation along with smaller phrase technology. Similar sounding words can be transcribed inadequately or may not be corrected upon review. Any transcriptional errors that result from this process are unintentional.      Disposition:  Follow up  Signed, Lindell Spar, MD  09/25/2022 3:16 PM     [facility]

## 2022-12-19 ENCOUNTER — Encounter: Payer: Self-pay | Admitting: Radiology

## 2022-12-24 ENCOUNTER — Telehealth: Payer: Self-pay | Admitting: Internal Medicine

## 2022-12-24 NOTE — Telephone Encounter (Signed)
scheduled

## 2022-12-24 NOTE — Telephone Encounter (Signed)
Patient called asking for a higher dose or what does patient need to do her blood pressure  Readings upper 130   138/70 pulse 61 Started on 20 mg and currently doing 40 mg now for a month been taking 1 1/2 pill.  Please call patient   telmisartan (MICARDIS) 40 MG tablet [id number]   Pharmacy  [email or web address] for her pharmacy Phone # [phone]

## 2023-01-01 ENCOUNTER — Ambulatory Visit (INDEPENDENT_AMBULATORY_CARE_PROVIDER_SITE_OTHER): Payer: Medicare Other | Admitting: Internal Medicine

## 2023-01-01 ENCOUNTER — Encounter: Payer: Self-pay | Admitting: Internal Medicine

## 2023-01-01 VITALS — BP 158/66 | HR 70 | Ht 66.0 in | Wt 150.0 lb

## 2023-01-01 DIAGNOSIS — F5101 Primary insomnia: Secondary | ICD-10-CM

## 2023-01-01 DIAGNOSIS — I10 Essential (primary) hypertension: Secondary | ICD-10-CM | POA: Diagnosis not present

## 2023-01-01 DIAGNOSIS — E782 Mixed hyperlipidemia: Secondary | ICD-10-CM | POA: Diagnosis not present

## 2023-01-01 DIAGNOSIS — F419 Anxiety disorder, unspecified: Secondary | ICD-10-CM

## 2023-01-01 DIAGNOSIS — G4733 Obstructive sleep apnea (adult) (pediatric): Secondary | ICD-10-CM | POA: Diagnosis not present

## 2023-01-01 MED ORDER — TELMISARTAN-HCTZ 80-12.5 MG PO TABS: 1.0000 | ORAL_TABLET | Freq: Every day | ORAL | 1 refills | Status: DC

## 2023-01-01 NOTE — Assessment & Plan Note (Signed)
Well-controlled with Paxil Has insomnia, advised to take Paxil in AM 

## 2023-01-01 NOTE — Assessment & Plan Note (Addendum)
Checked lipid profile Discussed about statin, she prefers to do diet modification for now Advised to follow DASH diet

## 2023-01-01 NOTE — Patient Instructions (Signed)
Please start taking Telmisartan-HCTZ 80-12.5 mg once daily.  Please continue to follow DASH diet and perform moderate exercise/walking at least 150 mins/week.

## 2023-01-01 NOTE — Assessment & Plan Note (Addendum)
BP Readings from Last 1 Encounters:  01/01/23 (!) 158/66   Uncontrolled with Telmisartan 40 mg QD Started Telmisartan-HCTZ 80-12.5 mg QD instead Advised to contact if SBP remains above 140 Check BMP Counseled for compliance with the medications Advised DASH diet and moderate exercise/walking, at least 150 mins/week

## 2023-01-01 NOTE — Progress Notes (Signed)
Established Patient Office Visit  Subjective:  Patient ID: Theresa Matthews, female    DOB: 01/29/1956  Age: 67 y.o. MRN: [id number]  CC:  Chief Complaint  Patient presents with   Hypertension    Follow up hypertension and medication discussion    HPI Theresa Matthews is a 67 y.o. female with past medical history of anxiety and HLD who presents for f/u of her chronic medical conditions.  GAD: Currently well-controlled with Paxil. She c/o chronic insomnia, had difficulty maintaining sleep.  She has changed her timing of Paxil to every morning instead of nightly.  She has seen mild improvement with it and with trazodone now.  She denies anhedonia, SI or HI.  HTN: Her BP was elevated today. She reports high BP readings at home as well, ~ 140s/80s. She has been taking Telmisartan 60 mg currently. She denies headache, dizziness, chest pain, dyspnea or palpitations.  OSA: Did not tolerate CPAP in the past. Had sleep study in [place].      Past Medical History:  Diagnosis Date   Anxiety    Chest pain 12/10/2020    Past Surgical History:  Procedure Laterality Date   APPENDECTOMY     NOSE SURGERY      Family History  Problem Relation Age of Onset   Hypertension Mother    Hypothyroidism Mother    Heart disease Father    Bladder Cancer Father    Brain cancer Brother     Social History   Socioeconomic History   Marital status: Married    Spouse name: Not on file   Number of children: Not on file   Years of education: Not on file   Highest education level: Not on file  Occupational History   Occupation: RN  Tobacco Use   Smoking status: Never   Smokeless tobacco: Never  Vaping Use   Vaping Use: Never used  Substance and Sexual Activity   Alcohol use: Not Currently   Drug use: Never   Sexual activity: Yes    Partners: Male  Other Topics Concern   Not on file  Social History Narrative   Is a fraternal twin. Grew up in [place].    Married, married for over 5  years.   4 children.    Lives in [place], [place].   Eats all food groups.   Wear seatbelt.   Exercise, hike trails, ride bikes.   Social Determinants of Health   Financial Resource Strain: Low Risk  (03/02/2018)   Overall Financial Resource Strain (CARDIA)    Difficulty of Paying Living Expenses: Not hard at all  Food Insecurity: No Food Insecurity (03/02/2018)   Hunger Vital Sign    Worried About Running Out of Food in the Last Year: Never true    Ran Out of Food in the Last Year: Never true  Transportation Needs: No Transportation Needs (03/02/2018)   PRAPARE - Hydrologist (Medical): No    Lack of Transportation (Non-Medical): No  Physical Activity: Sufficiently Active (03/02/2018)   Exercise Vital Sign    Days of Exercise per Week: 4 days    Minutes of Exercise per Session: 60 min  Stress: No Stress Concern Present (03/02/2018)   [place]    Feeling of Stress : Only a little  Social Connections: Socially Integrated (03/02/2018)   Social Connection and Isolation Panel [NHANES]    Frequency of Communication with Friends and Family: More than three  times a week    Frequency of Social Gatherings with Friends and Family: Three times a week    Attends Religious Services: More than 4 times per year    Active Member of Clubs or Organizations: Yes    Attends Archivist Meetings: More than 4 times per year    Marital Status: Married  Human resources officer Violence: Not At Risk (03/02/2018)   Humiliation, Afraid, Rape, and Kick questionnaire    Fear of Current or Ex-Partner: No    Emotionally Abused: No    Physically Abused: No    Sexually Abused: No    Outpatient Medications Prior to Visit  Medication Sig Dispense Refill   PARoxetine (PAXIL) 10 MG tablet Take 1 tablet (10 mg total) by mouth daily. 90 tablet 1   traZODone (DESYREL) 50 MG tablet TAKE 1/2 TO 1 TABLET BY MOUTH AT BEDTIME AS  NEEDED FOR SLEEP 180 tablet 2   telmisartan (MICARDIS) 40 MG tablet Take 1 tablet (40 mg total) by mouth daily. 30 tablet 3   No facility-administered medications prior to visit.    No Known Allergies  ROS Review of Systems  Constitutional:  Negative for chills and fever.  HENT:  Negative for congestion, sinus pressure, sinus pain and sore throat.   Eyes:  Negative for pain and discharge.  Respiratory:  Negative for cough and shortness of breath.   Cardiovascular:  Negative for chest pain and palpitations.  Gastrointestinal:  Negative for abdominal pain, constipation, diarrhea, nausea and vomiting.  Endocrine: Negative for polydipsia and polyuria.  Genitourinary:  Negative for dysuria and hematuria.  Musculoskeletal:  Negative for neck pain and neck stiffness.  Skin:  Negative for rash.  Neurological:  Negative for dizziness and weakness.  Psychiatric/Behavioral:  Negative for agitation and behavioral problems.       Objective:    Physical Exam Vitals reviewed.  Constitutional:      General: She is not in acute distress.    Appearance: She is not diaphoretic.  HENT:     Head: Normocephalic and atraumatic.     Mouth/Throat:     Mouth: Mucous membranes are moist.  Eyes:     General: No scleral icterus.    Extraocular Movements: Extraocular movements intact.  Cardiovascular:     Rate and Rhythm: Normal rate and regular rhythm.     Heart sounds: Normal heart sounds. No murmur heard. Pulmonary:     Breath sounds: Normal breath sounds. No wheezing or rales.  Musculoskeletal:     Cervical back: Neck supple. No tenderness.     Right lower leg: No edema.     Left lower leg: No edema.  Skin:    General: Skin is warm.     Findings: No rash.  Neurological:     General: No focal deficit present.     Mental Status: She is alert and oriented to person, place, and time.     Sensory: No sensory deficit.     Motor: No weakness.  Psychiatric:        Mood and Affect: Mood normal.         Behavior: Behavior normal.     BP (!) 158/66 (BP Location: Left Arm)   Pulse 70   Ht 5\' 6"  (1.676 m)   Wt 150 lb (68 kg)   SpO2 99%   BMI 24.21 kg/m  Wt Readings from Last 3 Encounters:  01/01/23 150 lb (68 kg)  08/26/22 143 lb 9.6 oz (65.1 kg)  07/31/22 144  lb 12.8 oz (65.7 kg)    Lab Results  Component Value Date   TSH 1.600 09/18/2022   Lab Results  Component Value Date   WBC 5.1 09/18/2022   HGB 14.7 09/18/2022   HCT 44.5 09/18/2022   MCV 94 09/18/2022   PLT 286 09/18/2022   Lab Results  Component Value Date   NA 143 09/18/2022   K 4.4 09/18/2022   CO2 28 09/18/2022   GLUCOSE 91 09/18/2022   BUN 16 09/18/2022   CREATININE 0.73 09/18/2022   BILITOT 0.5 09/18/2022   ALKPHOS 64 09/18/2022   AST 22 09/18/2022   ALT 20 09/18/2022   PROT 6.5 09/18/2022   ALBUMIN 4.4 09/18/2022   CALCIUM 9.5 09/18/2022   ANIONGAP 14 12/11/2020   EGFR 90 09/18/2022   Lab Results  Component Value Date   CHOL 235 (H) 09/18/2022   Lab Results  Component Value Date   HDL 77 09/18/2022   Lab Results  Component Value Date   LDLCALC 147 (H) 09/18/2022   Lab Results  Component Value Date   TRIG 67 09/18/2022   Lab Results  Component Value Date   CHOLHDL 3.1 09/18/2022   Lab Results  Component Value Date   HGBA1C 5.4 12/11/2020      Assessment & Plan:   Problem List Items Addressed This Visit       Cardiovascular and Mediastinum   Essential hypertension - Primary    BP Readings from Last 1 Encounters:  01/01/23 (!) 158/66  Uncontrolled with Telmisartan 40 mg QD Started Telmisartan-HCTZ 80-12.5 mg QD instead Advised to contact if SBP remains above 140 Check BMP Counseled for compliance with the medications Advised DASH diet and moderate exercise/walking, at least 150 mins/week      Relevant Medications   telmisartan-hydrochlorothiazide (MICARDIS HCT) 80-12.5 MG tablet   Other Relevant Orders   CMP14+EGFR     Respiratory   OSA (obstructive sleep  apnea)    Uses snore guard Used to have CPAP, did not tolerate. Referred to Pulmonology for [facility] evaluation        Other   Anxiety    Well-controlled with Paxil Has insomnia, advised to take Paxil in AM      Hyperlipidemia    Checked lipid profile Discussed about statin, she prefers to do diet modification for now Advised to follow DASH diet      Relevant Medications   telmisartan-hydrochlorothiazide (MICARDIS HCT) 80-12.5 MG tablet   Other Relevant Orders   Lipid Profile   Primary insomnia    Trazodone as needed for insomnia - now improved       Meds ordered this encounter  Medications   telmisartan-hydrochlorothiazide (MICARDIS HCT) 80-12.5 MG tablet    Sig: Take 1 tablet by mouth daily.    Dispense:  90 tablet    Refill:  1    Please discontinue Telmisartan 40 mg.    Follow-up: Return in about 4 months (around 05/03/2023) for HTN and GAD.    Lindell Spar, MD

## 2023-01-03 ENCOUNTER — Encounter: Payer: Self-pay | Admitting: Internal Medicine

## 2023-01-03 NOTE — Assessment & Plan Note (Signed)
Uses snore guard Used to have CPAP, did not tolerate. Referred to Pulmonology for Inspire evaluation 

## 2023-01-03 NOTE — Assessment & Plan Note (Signed)
Trazodone as needed for insomnia - now improved

## 2023-01-14 ENCOUNTER — Telehealth: Payer: Self-pay | Admitting: Internal Medicine

## 2023-01-14 ENCOUNTER — Other Ambulatory Visit: Payer: Self-pay

## 2023-01-14 DIAGNOSIS — I10 Essential (primary) hypertension: Secondary | ICD-10-CM

## 2023-01-14 MED ORDER — TELMISARTAN-HCTZ 80-12.5 MG PO TABS: 1.0000 | ORAL_TABLET | Freq: Every day | ORAL | 1 refills | Status: DC

## 2023-01-14 NOTE — Telephone Encounter (Signed)
Pt called in pharmacy [facility], [address], [place] [address] Phone: [phone]  Fax: [phone]   Never received med request for telmisartan-hydrochlorothiazide (MICARDIS HCT) 80-12.5 MG tablet   Patient needs med sent in

## 2023-01-14 NOTE — Telephone Encounter (Signed)
Refill sent to pharmacy.   

## 2023-01-16 ENCOUNTER — Telehealth: Payer: Self-pay | Admitting: Internal Medicine

## 2023-01-16 ENCOUNTER — Other Ambulatory Visit: Payer: Self-pay

## 2023-01-16 DIAGNOSIS — I10 Essential (primary) hypertension: Secondary | ICD-10-CM

## 2023-01-16 MED ORDER — TELMISARTAN-HCTZ 80-12.5 MG PO TABS: 1.0000 | ORAL_TABLET | Freq: Every day | ORAL | 1 refills | Status: DC

## 2023-01-16 NOTE — Telephone Encounter (Signed)
Phar med was sent to does not have the medication. Can you please resend to [facility]?   Pt states she is sorry for the trouble      telmisartan-hydrochlorothiazide (MICARDIS HCT) 80-12.5 MG tablet

## 2023-01-16 NOTE — Telephone Encounter (Signed)
Refills sent to pharmacy. 

## 2023-02-05 DIAGNOSIS — H43811 Vitreous degeneration, right eye: Secondary | ICD-10-CM | POA: Diagnosis not present

## 2023-02-05 DIAGNOSIS — H40013 Open angle with borderline findings, low risk, bilateral: Secondary | ICD-10-CM | POA: Diagnosis not present

## 2023-02-05 DIAGNOSIS — H2513 Age-related nuclear cataract, bilateral: Secondary | ICD-10-CM | POA: Diagnosis not present

## 2023-02-16 ENCOUNTER — Other Ambulatory Visit: Payer: Self-pay | Admitting: Internal Medicine

## 2023-02-16 DIAGNOSIS — F419 Anxiety disorder, unspecified: Secondary | ICD-10-CM

## 2023-04-03 ENCOUNTER — Ambulatory Visit: Payer: Medicare Other | Admitting: Adult Health

## 2023-04-17 ENCOUNTER — Ambulatory Visit (INDEPENDENT_AMBULATORY_CARE_PROVIDER_SITE_OTHER): Payer: Medicare Other

## 2023-04-17 VITALS — Ht 66.0 in | Wt 140.0 lb

## 2023-04-17 DIAGNOSIS — Z1231 Encounter for screening mammogram for malignant neoplasm of breast: Secondary | ICD-10-CM

## 2023-04-17 DIAGNOSIS — Z780 Asymptomatic menopausal state: Secondary | ICD-10-CM

## 2023-04-17 DIAGNOSIS — Z0000 Encounter for general adult medical examination without abnormal findings: Secondary | ICD-10-CM | POA: Diagnosis not present

## 2023-04-17 NOTE — Progress Notes (Signed)
 Subjective:   Theresa Matthews is a 67 y.o. female who presents for Medicare Annual (Subsequent) preventive examination.  Visit Complete: Virtual  I connected with  Tonna Boehringer on 04/17/23 by a audio enabled telemedicine application and verified that I am speaking with the correct person using two identifiers.  Patient Location: Home  Provider Location: Home Office  I discussed the limitations of evaluation and management by telemedicine. The patient expressed understanding and agreed to proceed.  Patient Medicare AWV questionnaire was completed by the patient on n/a; I have confirmed that all information answered by patient is correct and no changes since this date.  Review of Systems     Cardiac Risk Factors include: hypertension;advanced age (>43men, >5 women)     Objective:    Today's Vitals   04/17/23 1528  Weight: 140 lb (63.5 kg)  Height: 5\' 6"  (1.676 m)   Body mass index is 22.6 kg/m.     04/17/2023    3:28 PM 12/11/2020    1:51 AM  Advanced Directives  Does Patient Have a Medical Advance Directive? No No  Would patient like information on creating a medical advance directive? No - Patient declined No - Patient declined    Current Medications (verified) Outpatient Encounter Medications as of 04/17/2023  Medication Sig   PARoxetine (PAXIL) 10 MG tablet TAKE 1 TABLET BY MOUTH EVERY DAY   telmisartan-hydrochlorothiazide (MICARDIS HCT) 80-12.5 MG tablet Take 1 tablet by mouth daily.   traZODone (DESYREL) 50 MG tablet TAKE 1/2 TO 1 TABLET BY MOUTH AT BEDTIME AS NEEDED FOR SLEEP (Patient not taking: Reported on 04/17/2023)   No facility-administered encounter medications on file as of 04/17/2023.    Allergies (verified) Patient has no known allergies.   History: Past Medical History:  Diagnosis Date   Anxiety    Chest pain 12/10/2020   Past Surgical History:  Procedure Laterality Date   APPENDECTOMY     NOSE SURGERY     Family History  Problem Relation  Age of Onset   Hypertension Mother    Hypothyroidism Mother    Heart disease Father    Bladder Cancer Father    Brain cancer Brother    Social History   Socioeconomic History   Marital status: Married    Spouse name: Not on file   Number of children: Not on file   Years of education: Not on file   Highest education level: Not on file  Occupational History   Occupation: RN  Tobacco Use   Smoking status: Never   Smokeless tobacco: Never  Vaping Use   Vaping Use: Never used  Substance and Sexual Activity   Alcohol use: Not Currently   Drug use: Never   Sexual activity: Yes    Partners: Male  Other Topics Concern   Not on file  Social History Narrative   Is a fraternal twin. Grew up in [place].    Married, married for over 40 years.   4 children.    Lives in [place], [place].   Eats all food groups.   Wear seatbelt.   Exercise, hike trails, ride bikes.   Social Determinants of Health   Financial Resource Strain: Low Risk  (04/17/2023)   Overall Financial Resource Strain (CARDIA)    Difficulty of Paying Living Expenses: Not hard at all  Food Insecurity: No Food Insecurity (04/17/2023)   Hunger Vital Sign    Worried About Running Out of Food in the Last Year: Never true    Ran  Out of Food in the Last Year: Never true  Transportation Needs: No Transportation Needs (04/17/2023)   PRAPARE - Administrator, Civil Service (Medical): No    Lack of Transportation (Non-Medical): No  Physical Activity: Sufficiently Active (04/17/2023)   Exercise Vital Sign    Days of Exercise per Week: 4 days    Minutes of Exercise per Session: 60 min  Stress: No Stress Concern Present (04/17/2023)   [employer] of Occupational Health - Occupational Stress Questionnaire    Feeling of Stress : Only a little  Social Connections: Socially Integrated (04/17/2023)   Social Connection and Isolation Panel [NHANES]    Frequency of Communication with Friends and Family: More than  three times a week    Frequency of Social Gatherings with Friends and Family: More than three times a week    Attends Religious Services: More than 4 times per year    Active Member of [employer] or Organizations: Yes    Attends Engineer, structural: More than 4 times per year    Marital Status: Married    Tobacco Counseling Counseling given: Yes   Clinical Intake:  Pre-visit preparation completed: Yes  Pain : No/denies pain     BMI - recorded: 22.6 Nutritional Status: BMI of 19-24  Normal Nutritional Risks: None Diabetes: No  How often do you need to have someone help you when you read instructions, pamphlets, or other written materials from your doctor or pharmacy?: 1 - Never  Interpreter Needed?: No  Information entered by ::  Sohana Austell, CMA   Activities of Daily Living    04/17/2023    3:33 PM  In your present state of health, do you have any difficulty performing the following activities:  Hearing? 0  Vision? 0  Difficulty concentrating or making decisions? 0  Walking or climbing stairs? 0  Dressing or bathing? 0  Doing errands, shopping? 0  Preparing Food and eating ? N  Using the Toilet? N  In the past six months, have you accidently leaked urine? N  Do you have problems with loss of bowel control? N  Managing your Medications? N  Managing your Finances? N  Housekeeping or managing your Housekeeping? N    Patient Care Team: Anabel Halon, MD as PCP - General (Internal Medicine)  Indicate any recent Medical Services you may have received from other than [facility] providers in the past year (date may be approximate).     Assessment:   This is a routine wellness examination for [place].  Hearing/Vision screen Hearing Screening - Comments:: Patient denies any hearing difficulties.    Dietary issues and exercise activities discussed:     Goals Addressed             This Visit's Progress    Patient Stated       LOWER HER CHOLOESTEROL         Depression Screen    04/17/2023    3:31 PM 07/31/2022    4:07 PM 01/25/2021    3:20 PM 03/02/2018    8:10 AM  PHQ 2/9 Scores  PHQ - 2 Score 0 0 0 0    Fall Risk    04/17/2023    3:31 PM 07/31/2022    4:07 PM 01/25/2021    3:19 PM 03/02/2018    8:10 AM  Fall Risk   Falls in the past year? 0 0 0 No  Number falls in past yr: 0 0 0   Injury with Fall?  0 0 0   Risk for fall due to : No Fall Risks No Fall Risks No Fall Risks   Follow up Falls prevention discussed Falls evaluation completed Falls evaluation completed     MEDICARE RISK AT HOME:  Medicare Risk at Home - 04/17/23 1530     Any stairs in or around the home? Yes    If so, are there any without handrails? No    Home free of loose throw rugs in walkways, pet beds, electrical cords, etc? Yes    Adequate lighting in your home to reduce risk of falls? Yes    Life alert? No    Use of a cane, walker or w/c? No    Grab bars in the bathroom? No    Shower chair or bench in shower? No    Elevated toilet seat or a handicapped toilet? No             TIMED UP AND GO:  Was the test performed?  No    Cognitive Function:        04/17/2023    3:30 PM  6CIT Screen  What Year? 0 points  What month? 0 points  What time? 0 points  Count back from 20 0 points  Months in reverse 0 points  Repeat phrase 0 points  Total Score 0 points    Immunizations Immunization History  Administered Date(s) Administered   Fluad Quad(high Dose 65+) 08/19/2022   Hepatitis B, PED/ADOLESCENT 05/16/2005   Moderna Sars-Covid-2 Vaccination 09/21/2019, 10/22/2019   Tdap 05/16/2005   Zoster Recombinat (Shingrix) 03/15/2021, 05/31/2021    TDAP status: Due, Education has been provided regarding the importance of this vaccine. Advised may receive this vaccine at local pharmacy or Health Dept. Aware to provide a copy of the vaccination record if obtained from local pharmacy or Health Dept. Verbalized acceptance and understanding.  Flu  Vaccine status: Up to date  Pneumococcal vaccine status: Due, Education has been provided regarding the importance of this vaccine. Advised may receive this vaccine at local pharmacy or Health Dept. Aware to provide a copy of the vaccination record if obtained from local pharmacy or Health Dept. Verbalized acceptance and understanding.  Covid-19 vaccine status: Information provided on how to obtain vaccines.   Qualifies for Shingles Vaccine? Yes   Zostavax completed Yes   Shingrix Completed?: Yes  Screening Tests Health Maintenance  Topic Date Due   Medicare Annual Wellness (AWV)  Never done   DTaP/Tdap/Td (2 - Td or Tdap) 05/17/2015   Pneumonia Vaccine 21+ Years old (1 of 1 - PCV) Never done   COVID-19 Vaccine (3 - 2023-24 season) 06/21/2022   INFLUENZA VACCINE  05/22/2023   MAMMOGRAM  02/28/2024   Colonoscopy  10/21/2025   DEXA SCAN  Completed   Hepatitis C Screening  Completed   Zoster Vaccines- Shingrix  Completed   HPV VACCINES  Aged Out    Health Maintenance  Health Maintenance Due  Topic Date Due   Medicare Annual Wellness (AWV)  Never done   DTaP/Tdap/Td (2 - Td or Tdap) 05/17/2015   Pneumonia Vaccine 31+ Years old (1 of 1 - PCV) Never done   COVID-19 Vaccine (3 - 2023-24 season) 06/21/2022    Colorectal cancer screening: Type of screening: Colonoscopy. Completed 10/22/15. Repeat every 10 years  Mammogram status: Ordered 04/17/23. Pt provided with contact info and advised to call to schedule appt.   Bone Density status: Ordered 04/17/23. Pt provided with contact info and advised to call  to schedule appt.  Lung Cancer Screening: (Low Dose CT Chest recommended if Age 32-80 years, 20 pack-year currently smoking OR have quit w/in 15years.) does not qualify.   Lung Cancer Screening Referral: N/A  Additional Screening:  Hepatitis C Screening: does not qualify; Completed 03/02/2018  Vision Screening: Recommended annual ophthalmology exams for early detection of glaucoma  and other disorders of the eye. Is the patient up to date with their annual eye exam?  Yes  Who is the provider or what is the name of the office in which the patient attends annual eye exams? [facility] If pt is not established with a provider, would they like to be referred to a provider to establish care? No .   Dental Screening: Recommended annual dental exams for proper oral hygiene  Diabetic Foot Exam: N/A  Community Resource Referral / Chronic Care Management: CRR required this visit?  No   CCM required this visit?  No     Plan:     I have personally reviewed and noted the following in the patient's chart:   Medical and social history Use of alcohol, tobacco or illicit drugs  Current medications and supplements including opioid prescriptions. Patient is not currently taking opioid prescriptions. Functional ability and status Nutritional status Physical activity Advanced directives List of other physicians Hospitalizations, surgeries, and ER visits in previous 12 months Vitals Screenings to include cognitive, depression, and falls Referrals and appointments  In addition, I have reviewed and discussed with patient certain preventive protocols, quality metrics, and best practice recommendations. A written personalized care plan for preventive services as well as general preventive health recommendations were provided to patient.   Because this visit was a virtual/telehealth visit,  certain criteria was not obtained, such a blood pressure, CBG if patient is a diabetic, and timed up and go.   Any medications not marked as taking were not mentioned by the patient (or their caregiver if applicable) when reconciling the medications.    Jordan Hawks Dayani Winbush, CMA   04/17/2023   After Visit Summary: (MyChart) Due to this being a telephonic visit, the after visit summary with patients personalized plan was offered to patient via MyChart   Nurse Notes: mammogram and dexa scan  ordered today

## 2023-04-17 NOTE — Patient Instructions (Addendum)
[name] , Thank you for taking time to come for your Medicare Wellness Visit. I appreciate your ongoing commitment to your health goals. Please review the following plan we discussed and let me know if I can assist you in the future.   These are the goals we discussed:  Goals      Patient Stated     LOWER HER CHOLOESTEROL         This is a list of the screening recommended for you and due dates:  Health Maintenance  Topic Date Due   DTaP/Tdap/Td vaccine (2 - Td or Tdap) 05/17/2015   Pneumonia Vaccine (1 of 1 - PCV) Never done   COVID-19 Vaccine (3 - 2023-24 season) 06/21/2022   Flu Shot  05/22/2023   Mammogram  02/28/2024   Medicare Annual Wellness Visit  04/16/2024   Colon Cancer Screening  10/21/2025   DEXA scan (bone density measurement)  Completed   Hepatitis C Screening  Completed   Zoster (Shingles) Vaccine  Completed   HPV Vaccine  Aged Out    Advanced directives: Advance directive discussed with you today. Even though you declined this today, please call our office should you change your mind, and we can give you the proper paperwork for you to fill out. Advance care planning is a way to make decisions about medical care that fits your values in case you are ever unable to make these decisions for yourself.  Information on Advanced Care Planning can be found at [facility] Advance Health Care Directives Advance Health Care Directives ([email or web address]    Conditions/risks identified: An order has been placed for you to update your mammogram and bone density scan to be done at [facility]. Please call and make your appointment  Next appointment: VIRTUAL/TELEPHONE APPOINTMENT Follow up in one year for your annual wellness visit  May 26, 2024 at 1:30pm telephone visit.    Preventive Care 52 Years and Older, Female Preventive care refers to lifestyle choices and visits with your health care provider that can promote health and  wellness. What does preventive care include? A yearly physical exam. This is also called an annual well check. Dental exams once or twice a year. Routine eye exams. Ask your health care provider how often you should have your eyes checked. Personal lifestyle choices, including: Daily care of your teeth and gums. Regular physical activity. Eating a healthy diet. Avoiding tobacco and drug use. Limiting alcohol use. Practicing safe sex. Taking low-dose aspirin every day. Taking vitamin and mineral supplements as recommended by your health care provider. What happens during an annual well check? The services and screenings done by your health care provider during your annual well check will depend on your age, overall health, lifestyle risk factors, and family history of disease. Counseling  Your health care provider may ask you questions about your: Alcohol use. Tobacco use. Drug use. Emotional well-being. Home and relationship well-being. Sexual activity. Eating habits. History of falls. Memory and ability to understand (cognition). Work and work Astronomer. Reproductive health. Screening  You may have the following tests or measurements: Height, weight, and BMI. Blood pressure. Lipid and cholesterol levels. These may be checked every 5 years, or more frequently if you are over 83 years old. Skin check. Lung cancer screening. You may have this screening every year starting at age 87 if you have a 30-pack-year history of smoking and currently smoke or have quit within the past 15 years. Fecal occult blood test (  FOBT) of the stool. You may have this test every year starting at age 12. Flexible sigmoidoscopy or colonoscopy. You may have a sigmoidoscopy every 5 years or a colonoscopy every 10 years starting at age 55. Hepatitis C blood test. Hepatitis B blood test. Sexually transmitted disease (STD) testing. Diabetes screening. This is done by checking your blood sugar (glucose)  after you have not eaten for a while (fasting). You may have this done every 1-3 years. Bone density scan. This is done to screen for osteoporosis. You may have this done starting at age 78. Mammogram. This may be done every 1-2 years. Talk to your health care provider about how often you should have regular mammograms. Talk with your health care provider about your test results, treatment options, and if necessary, the need for more tests. Vaccines  Your health care provider may recommend certain vaccines, such as: Influenza vaccine. This is recommended every year. Tetanus, diphtheria, and acellular pertussis (Tdap, Td) vaccine. You may need a Td booster every 10 years. Zoster vaccine. You may need this after age 68. Pneumococcal 13-valent conjugate (PCV13) vaccine. One dose is recommended after age 11. Pneumococcal polysaccharide (PPSV23) vaccine. One dose is recommended after age 69. Talk to your health care provider about which screenings and vaccines you need and how often you need them. This information is not intended to replace advice given to you by your health care provider. Make sure you discuss any questions you have with your health care provider. Document Released: 11/03/2015 Document Revised: 06/26/2016 Document Reviewed: 08/08/2015 Elsevier Interactive Patient Education  2017 [employer].  Fall Prevention in the Home Falls can cause injuries. They can happen to people of all ages. There are many things you can do to make your home safe and to help prevent falls. What can I do on the outside of my home? Regularly fix the edges of walkways and driveways and fix any cracks. Remove anything that might make you trip as you walk through a door, such as a raised step or threshold. Trim any bushes or trees on the path to your home. Use bright outdoor lighting. Clear any walking paths of anything that might make someone trip, such as rocks or tools. Regularly check to see if  handrails are loose or broken. Make sure that both sides of any steps have handrails. Any raised decks and porches should have guardrails on the edges. Have any leaves, snow, or ice cleared regularly. Use sand or salt on walking paths during winter. Clean up any spills in your garage right away. This includes oil or grease spills. What can I do in the bathroom? Use night lights. Install grab bars by the toilet and in the tub and shower. Do not use towel bars as grab bars. Use non-skid mats or decals in the tub or shower. If you need to sit down in the shower, use a plastic, non-slip stool. Keep the floor dry. Clean up any water that spills on the floor as soon as it happens. Remove soap buildup in the tub or shower regularly. Attach bath mats securely with double-sided non-slip rug tape. Do not have throw rugs and other things on the floor that can make you trip. What can I do in the bedroom? Use night lights. Make sure that you have a light by your bed that is easy to reach. Do not use any sheets or blankets that are too big for your bed. They should not hang down onto the floor. Have a  firm chair that has side arms. You can use this for support while you get dressed. Do not have throw rugs and other things on the floor that can make you trip. What can I do in the kitchen? Clean up any spills right away. Avoid walking on wet floors. Keep items that you use a lot in easy-to-reach places. If you need to reach something above you, use a strong step stool that has a grab bar. Keep electrical cords out of the way. Do not use floor polish or wax that makes floors slippery. If you must use wax, use non-skid floor wax. Do not have throw rugs and other things on the floor that can make you trip. What can I do with my stairs? Do not leave any items on the stairs. Make sure that there are handrails on both sides of the stairs and use them. Fix handrails that are broken or loose. Make sure that  handrails are as long as the stairways. Check any carpeting to make sure that it is firmly attached to the stairs. Fix any carpet that is loose or worn. Avoid having throw rugs at the top or bottom of the stairs. If you do have throw rugs, attach them to the floor with carpet tape. Make sure that you have a light switch at the top of the stairs and the bottom of the stairs. If you do not have them, ask someone to add them for you. What else can I do to help prevent falls? Wear shoes that: Do not have high heels. Have rubber bottoms. Are comfortable and fit you well. Are closed at the toe. Do not wear sandals. If you use a stepladder: Make sure that it is fully opened. Do not climb a closed stepladder. Make sure that both sides of the stepladder are locked into place. Ask someone to hold it for you, if possible. Clearly mark and make sure that you can see: Any grab bars or handrails. First and last steps. Where the edge of each step is. Use tools that help you move around (mobility aids) if they are needed. These include: Canes. Walkers. Scooters. Crutches. Turn on the lights when you go into a dark area. Replace any light bulbs as soon as they burn out. Set up your furniture so you have a clear path. Avoid moving your furniture around. If any of your floors are uneven, fix them. If there are any pets around you, be aware of where they are. Review your medicines with your doctor. Some medicines can make you feel dizzy. This can increase your chance of falling. Ask your doctor what other things that you can do to help prevent falls. This information is not intended to replace advice given to you by your health care provider. Make sure you discuss any questions you have with your health care provider. Document Released: 08/03/2009 Document Revised: 03/14/2016 Document Reviewed: 11/11/2014 Elsevier Interactive Patient Education  2017 [employer].

## 2023-05-07 ENCOUNTER — Encounter: Payer: Self-pay | Admitting: Internal Medicine

## 2023-05-07 ENCOUNTER — Ambulatory Visit (INDEPENDENT_AMBULATORY_CARE_PROVIDER_SITE_OTHER): Payer: Medicare Other | Admitting: Internal Medicine

## 2023-05-07 VITALS — BP 98/62 | HR 91 | Ht 66.0 in | Wt 143.0 lb

## 2023-05-07 DIAGNOSIS — F5101 Primary insomnia: Secondary | ICD-10-CM

## 2023-05-07 DIAGNOSIS — Z23 Encounter for immunization: Secondary | ICD-10-CM | POA: Diagnosis not present

## 2023-05-07 DIAGNOSIS — G4733 Obstructive sleep apnea (adult) (pediatric): Secondary | ICD-10-CM

## 2023-05-07 DIAGNOSIS — E782 Mixed hyperlipidemia: Secondary | ICD-10-CM

## 2023-05-07 DIAGNOSIS — F419 Anxiety disorder, unspecified: Secondary | ICD-10-CM

## 2023-05-07 DIAGNOSIS — I10 Essential (primary) hypertension: Secondary | ICD-10-CM | POA: Diagnosis not present

## 2023-05-07 MED ORDER — TELMISARTAN 80 MG PO TABS
80.0000 mg | ORAL_TABLET | Freq: Every day | ORAL | 1 refills | Status: DC
Start: 2023-05-07 — End: 2023-09-10

## 2023-05-07 NOTE — Assessment & Plan Note (Signed)
Uses snore guard Used to have CPAP, did not tolerate. Referred to Pulmonology for [facility] evaluation

## 2023-05-07 NOTE — Patient Instructions (Signed)
Please start taking Telmisartan 80 mg once daily instead of Telmisartan-hydrochlorothiazide.  Please continue to take medications as prescribed.  Please continue to follow low salt diet and perform moderate exercise/walking at least 150 mins/week.

## 2023-05-07 NOTE — Assessment & Plan Note (Addendum)
Well-controlled with Paxil 

## 2023-05-07 NOTE — Assessment & Plan Note (Addendum)
Checked lipid profile - LDL improved compared to prior Had discussed about statin, she prefers to do diet modification for now Advised to follow DASH diet

## 2023-05-07 NOTE — Progress Notes (Signed)
Established Patient Office Visit  Subjective:  Patient ID: Theresa Matthews, female    DOB: 1956/10/14  Age: 67 y.o. MRN: [id number]  CC:  Chief Complaint  Patient presents with   Hypertension    Four month follow up    Anxiety    Four month follow up     HPI Theresa Matthews is a 67 y.o. female with past medical history of anxiety and HLD who presents for f/u of her chronic medical conditions.  GAD: Currently well-controlled with Paxil. She c/o chronic insomnia, had difficulty maintaining sleep.  She had changed her timing of Paxil to every morning instead of nightly and trazodone, but had similar insomnia. She has started taking Paxil at bedtime again and states that she has insomnia, but is manageable.  She denies anhedonia, SI or HI.  HTN: Her BP was low normal today. She has been taking Telmisartan-HCTZ 80-12.5 mg QD currently - dose was increased in the last visit. She has had intermittent dizziness. She denies headache, chest pain, dyspnea or palpitations.  OSA: Did not tolerate CPAP in the past. Had sleep study in [place].      Past Medical History:  Diagnosis Date   Anxiety    Chest pain 12/10/2020    Past Surgical History:  Procedure Laterality Date   APPENDECTOMY     NOSE SURGERY      Family History  Problem Relation Age of Onset   Hypertension Mother    Hypothyroidism Mother    Heart disease Father    Bladder Cancer Father    Brain cancer Brother     Social History   Socioeconomic History   Marital status: Married    Spouse name: Not on file   Number of children: Not on file   Years of education: Not on file   Highest education level: Not on file  Occupational History   Occupation: RN  Tobacco Use   Smoking status: Never   Smokeless tobacco: Never  Vaping Use   Vaping status: Never Used  Substance and Sexual Activity   Alcohol use: Not Currently   Drug use: Never   Sexual activity: Yes    Partners: Male  Other Topics Concern   Not on file   Social History Narrative   Is a fraternal twin. Grew up in [place].    Married, married for over 40 years.   4 children.    Lives in [place], [place].   Eats all food groups.   Wear seatbelt.   Exercise, hike trails, ride bikes.   Social Determinants of Health   Financial Resource Strain: Low Risk  (04/17/2023)   Overall Financial Resource Strain (CARDIA)    Difficulty of Paying Living Expenses: Not hard at all  Food Insecurity: No Food Insecurity (04/17/2023)   Hunger Vital Sign    Worried About Running Out of Food in the Last Year: Never true    Ran Out of Food in the Last Year: Never true  Transportation Needs: No Transportation Needs (04/17/2023)   PRAPARE - Administrator, Civil Service (Medical): No    Lack of Transportation (Non-Medical): No  Physical Activity: Sufficiently Active (04/17/2023)   Exercise Vital Sign    Days of Exercise per Week: 4 days    Minutes of Exercise per Session: 60 min  Stress: No Stress Concern Present (04/17/2023)   [employer] of Occupational Health - Occupational Stress Questionnaire    Feeling of Stress : Only a little  Social Connections:  Socially Integrated (04/17/2023)   Social Connection and Isolation Panel [NHANES]    Frequency of Communication with Friends and Family: More than three times a week    Frequency of Social Gatherings with Friends and Family: More than three times a week    Attends Religious Services: More than 4 times per year    Active Member of [employer] or Organizations: Yes    Attends Engineer, structural: More than 4 times per year    Marital Status: Married  Catering manager Violence: Not At Risk (04/17/2023)   Humiliation, Afraid, Rape, and Kick questionnaire    Fear of Current or Ex-Partner: No    Emotionally Abused: No    Physically Abused: No    Sexually Abused: No    Outpatient Medications Prior to Visit  Medication Sig Dispense Refill   PARoxetine (PAXIL) 10 MG tablet TAKE 1 TABLET BY  MOUTH EVERY DAY 90 tablet 1   telmisartan-hydrochlorothiazide (MICARDIS HCT) 80-12.5 MG tablet Take 1 tablet by mouth daily. 90 tablet 1   traZODone (DESYREL) 50 MG tablet TAKE 1/2 TO 1 TABLET BY MOUTH AT BEDTIME AS NEEDED FOR SLEEP (Patient not taking: Reported on 04/17/2023) 180 tablet 2   No facility-administered medications prior to visit.    No Known Allergies  ROS Review of Systems  Constitutional:  Negative for chills and fever.  HENT:  Negative for congestion, sinus pressure, sinus pain and sore throat.   Eyes:  Negative for pain and discharge.  Respiratory:  Negative for cough and shortness of breath.   Cardiovascular:  Negative for chest pain and palpitations.  Gastrointestinal:  Negative for abdominal pain, constipation, diarrhea, nausea and vomiting.  Endocrine: Negative for polydipsia and polyuria.  Genitourinary:  Negative for dysuria and hematuria.  Musculoskeletal:  Negative for neck pain and neck stiffness.  Skin:  Negative for rash.  Neurological:  Negative for dizziness and weakness.  Psychiatric/Behavioral:  Positive for sleep disturbance. Negative for agitation and behavioral problems.       Objective:    Physical Exam Vitals reviewed.  Constitutional:      General: She is not in acute distress.    Appearance: She is not diaphoretic.  HENT:     Head: Normocephalic and atraumatic.     Mouth/Throat:     Mouth: Mucous membranes are moist.  Eyes:     General: No scleral icterus.    Extraocular Movements: Extraocular movements intact.  Cardiovascular:     Rate and Rhythm: Normal rate and regular rhythm.     Heart sounds: Normal heart sounds. No murmur heard. Pulmonary:     Breath sounds: Normal breath sounds. No wheezing or rales.  Musculoskeletal:     Cervical back: Neck supple. No tenderness.     Right lower leg: No edema.     Left lower leg: No edema.  Skin:    General: Skin is warm.     Findings: No rash.  Neurological:     General: No focal  deficit present.     Mental Status: She is alert and oriented to person, place, and time.     Sensory: No sensory deficit.     Motor: No weakness.  Psychiatric:        Mood and Affect: Mood normal.        Behavior: Behavior normal.     BP 98/62 (BP Location: Right Arm, Patient Position: Sitting, Cuff Size: Normal)   Pulse 91   Ht 5\' 6"  (1.676 m)   Wt  143 lb (64.9 kg)   SpO2 97%   BMI 23.08 kg/m  Wt Readings from Last 3 Encounters:  05/07/23 143 lb (64.9 kg)  04/17/23 140 lb (63.5 kg)  01/01/23 150 lb (68 kg)    Lab Results  Component Value Date   TSH 1.600 09/18/2022   Lab Results  Component Value Date   WBC 5.1 09/18/2022   HGB 14.7 09/18/2022   HCT 44.5 09/18/2022   MCV 94 09/18/2022   PLT 286 09/18/2022   Lab Results  Component Value Date   NA 141 05/07/2023   K 3.7 05/07/2023   CO2 24 05/07/2023   GLUCOSE 98 05/07/2023   BUN 24 05/07/2023   CREATININE 0.90 05/07/2023   BILITOT 0.6 05/07/2023   ALKPHOS 55 05/07/2023   AST 15 05/07/2023   ALT 14 05/07/2023   PROT 6.5 05/07/2023   ALBUMIN 4.2 05/07/2023   CALCIUM 9.6 05/07/2023   ANIONGAP 14 12/11/2020   EGFR 70 05/07/2023   Lab Results  Component Value Date   CHOL 220 (H) 05/07/2023   Lab Results  Component Value Date   HDL 72 05/07/2023   Lab Results  Component Value Date   LDLCALC 126 (H) 05/07/2023   Lab Results  Component Value Date   TRIG 129 05/07/2023   Lab Results  Component Value Date   CHOLHDL 3.1 05/07/2023   Lab Results  Component Value Date   HGBA1C 5.4 12/11/2020      Assessment & Plan:   Problem List Items Addressed This Visit       Cardiovascular and Mediastinum   Essential hypertension - Primary    BP Readings from Last 1 Encounters:  05/07/23 98/62   Was uncontrolled with Telmisartan 40 mg every day, had started Telmisartan-HCTZ 80-12.5 mg QD instead Has dizziness likely due to hypotension - DC hydrochlorothiazide, sent Telimsartan 80 mg QD only Advised to  contact if SBP remains above 140 Check BMP Counseled for compliance with the medications Advised DASH diet and moderate exercise/walking, at least 150 mins/week      Relevant Medications   telmisartan (MICARDIS) 80 MG tablet     Respiratory   OSA (obstructive sleep apnea)    Uses snore guard Used to have CPAP, did not tolerate. Referred to Pulmonology for [facility] evaluation        Other   Anxiety    Well-controlled with Paxil      Hyperlipidemia    Checked lipid profile - LDL improved compared to prior Had discussed about statin, she prefers to do diet modification for now Advised to follow DASH diet      Relevant Medications   telmisartan (MICARDIS) 80 MG tablet   Primary insomnia    Tried Trazodone as needed for insomnia, but could not continue due to drowsiness Sleep hygiene has been discussed      Other Visit Diagnoses     Need for pneumococcal 20-valent conjugate vaccination       Relevant Orders   Pneumococcal conjugate vaccine 20-valent (Completed)        Meds ordered this encounter  Medications   telmisartan (MICARDIS) 80 MG tablet    Sig: Take 1 tablet (80 mg total) by mouth daily.    Dispense:  90 tablet    Refill:  1    PLEASE DISCONTINUE TELMISARTAN-hydrochlorothiazide 80-12.5 MG PRESCRIPTION.    Follow-up: Return in about 4 months (around 09/07/2023).    Anabel Halon, MD

## 2023-05-08 LAB — CMP14+EGFR
ALT: 14 IU/L (ref 0–32)
AST: 15 IU/L (ref 0–40)
Albumin: 4.2 g/dL (ref 3.9–4.9)
Alkaline Phosphatase: 55 IU/L (ref 44–121)
BUN/Creatinine Ratio: 27 (ref 12–28)
BUN: 24 mg/dL (ref 8–27)
Bilirubin Total: 0.6 mg/dL (ref 0.0–1.2)
CO2: 24 mmol/L (ref 20–29)
Calcium: 9.6 mg/dL (ref 8.7–10.3)
Chloride: 100 mmol/L (ref 96–106)
Creatinine, Ser: 0.9 mg/dL (ref 0.57–1.00)
Globulin, Total: 2.3 g/dL (ref 1.5–4.5)
Glucose: 98 mg/dL (ref 70–99)
Potassium: 3.7 mmol/L (ref 3.5–5.2)
Sodium: 141 mmol/L (ref 134–144)
Total Protein: 6.5 g/dL (ref 6.0–8.5)
eGFR: 70 mL/min/{1.73_m2} (ref >59)

## 2023-05-08 LAB — LIPID PANEL
Chol/HDL Ratio: 3.1 ratio (ref 0.0–4.4)
Cholesterol, Total: 220 mg/dL — ABNORMAL HIGH (ref 100–199)
HDL: 72 mg/dL (ref >39)
LDL Chol Calc (NIH): 126 mg/dL — ABNORMAL HIGH (ref 0–99)
Triglycerides: 129 mg/dL (ref 0–149)
VLDL Cholesterol Cal: 22 mg/dL (ref 5–40)

## 2023-05-09 NOTE — Assessment & Plan Note (Signed)
Tried Trazodone as needed for insomnia, but could not continue due to drowsiness Sleep hygiene has been discussed

## 2023-05-09 NOTE — Assessment & Plan Note (Signed)
BP Readings from Last 1 Encounters:  05/07/23 98/62   Was uncontrolled with Telmisartan 40 mg every day, had started Telmisartan-HCTZ 80-12.5 mg QD instead Has dizziness likely due to hypotension - DC hydrochlorothiazide, sent Telimsartan 80 mg QD only Advised to contact if SBP remains above 140 Check BMP Counseled for compliance with the medications Advised DASH diet and moderate exercise/walking, at least 150 mins/week

## 2023-05-15 ENCOUNTER — Ambulatory Visit: Payer: Medicare Other | Admitting: Adult Health

## 2023-05-19 ENCOUNTER — Ambulatory Visit: Payer: Medicare Other | Admitting: Adult Health

## 2023-05-27 ENCOUNTER — Ambulatory Visit: Payer: Medicare Other | Admitting: Adult Health

## 2023-07-07 ENCOUNTER — Telehealth: Payer: Self-pay | Admitting: Adult Health

## 2023-07-07 DIAGNOSIS — G4733 Obstructive sleep apnea (adult) (pediatric): Secondary | ICD-10-CM

## 2023-07-07 NOTE — Telephone Encounter (Signed)
Patient states she did HST sometime in December. States she picked up the machine and dropped it off in the [facility] office. Please advise as her appointment on 8/6 was cancelled due to [facility] not having the results. She was informed our office would call her to reschedule when we had the results.

## 2023-07-07 NOTE — Telephone Encounter (Signed)
Not sure why it was cancelled the sleep study was done in 08/2022 but looks like it was not scanned in till 05/27/2023

## 2023-07-08 NOTE — Telephone Encounter (Signed)
Called to review test results , [id number]   Home sleep study showed mild sleep apnea with AHI at 8.3/hour and SpO2 low at 83%.  Treatment for this can be a referral for oral appliance to local orthodontist Dr. Myrtis Ser or start on CPAP therapy auto CPAP 5 to 15 cm H2O.  Was going to discuss these 2 options with patient to decide if she would like to proceed with treatment plan.

## 2023-07-11 NOTE — Telephone Encounter (Signed)
Lm x1 for the patient.

## 2023-07-11 NOTE — Telephone Encounter (Signed)
Can we see if patient got her results

## 2023-07-11 NOTE — Telephone Encounter (Signed)
Patient is calling back to get results of her study.

## 2023-07-12 NOTE — Telephone Encounter (Signed)
Called patient but she did not answer. Left message for patient to call the office back on Monday morning.

## 2023-07-14 NOTE — Telephone Encounter (Signed)
Pt called in to go over HST,

## 2023-07-15 NOTE — Telephone Encounter (Signed)
Pt aware of results of HST. Pt would like to proceed with oral device. Please place order for Dr. Myrtis Ser.

## 2023-07-16 NOTE — Telephone Encounter (Signed)
Order has been placed. Nothing further needed.

## 2023-07-26 DIAGNOSIS — Z1339 Encounter for screening examination for other mental health and behavioral disorders: Secondary | ICD-10-CM | POA: Diagnosis not present

## 2023-07-26 DIAGNOSIS — Z1331 Encounter for screening for depression: Secondary | ICD-10-CM | POA: Diagnosis not present

## 2023-07-26 DIAGNOSIS — J029 Acute pharyngitis, unspecified: Secondary | ICD-10-CM | POA: Diagnosis not present

## 2023-08-07 ENCOUNTER — Telehealth: Payer: Self-pay | Admitting: Adult Health

## 2023-08-07 DIAGNOSIS — G4733 Obstructive sleep apnea (adult) (pediatric): Secondary | ICD-10-CM

## 2023-08-07 NOTE — Telephone Encounter (Signed)
Patient would like referral for dental appliance. Would like to use [facility]. Phone number is [phone]. Fax number is [phone]. Patient phone number is [phone].

## 2023-08-08 NOTE — Telephone Encounter (Signed)
Order placed to [facility].

## 2023-08-18 DIAGNOSIS — D485 Neoplasm of uncertain behavior of skin: Secondary | ICD-10-CM | POA: Diagnosis not present

## 2023-08-18 DIAGNOSIS — C44621 Squamous cell carcinoma of skin of unspecified upper limb, including shoulder: Secondary | ICD-10-CM | POA: Diagnosis not present

## 2023-08-24 ENCOUNTER — Other Ambulatory Visit: Payer: Self-pay | Admitting: Internal Medicine

## 2023-08-24 DIAGNOSIS — F419 Anxiety disorder, unspecified: Secondary | ICD-10-CM

## 2023-08-26 DIAGNOSIS — C44629 Squamous cell carcinoma of skin of left upper limb, including shoulder: Secondary | ICD-10-CM | POA: Diagnosis not present

## 2023-09-10 ENCOUNTER — Ambulatory Visit (INDEPENDENT_AMBULATORY_CARE_PROVIDER_SITE_OTHER): Payer: Medicare Other | Admitting: Internal Medicine

## 2023-09-10 VITALS — BP 132/77 | HR 75 | Ht 66.0 in | Wt 145.2 lb

## 2023-09-10 DIAGNOSIS — I10 Essential (primary) hypertension: Secondary | ICD-10-CM

## 2023-09-10 DIAGNOSIS — F419 Anxiety disorder, unspecified: Secondary | ICD-10-CM | POA: Diagnosis not present

## 2023-09-10 DIAGNOSIS — E782 Mixed hyperlipidemia: Secondary | ICD-10-CM | POA: Diagnosis not present

## 2023-09-10 DIAGNOSIS — G4733 Obstructive sleep apnea (adult) (pediatric): Secondary | ICD-10-CM | POA: Diagnosis not present

## 2023-09-10 DIAGNOSIS — E559 Vitamin D deficiency, unspecified: Secondary | ICD-10-CM

## 2023-09-10 MED ORDER — TELMISARTAN 80 MG PO TABS: 80.0000 mg | ORAL_TABLET | Freq: Every day | ORAL | 1 refills | Status: DC

## 2023-09-10 NOTE — Assessment & Plan Note (Signed)
Well-controlled with Paxil 

## 2023-09-10 NOTE — Assessment & Plan Note (Signed)
Uses snore guard Used to have CPAP, did not tolerate. Referred to Pulmonology - plan to see dentist for dental appliance

## 2023-09-10 NOTE — Assessment & Plan Note (Signed)
BP Readings from Last 1 Encounters:  09/10/23 132/77   Well-controlled with Telmisartan 80 mg QD Had dizziness likely due to hypotension - DCed hydrochlorothiazide Advised to contact if SBP remains above 140 Counseled for compliance with the medications Advised DASH diet and moderate exercise/walking, at least 150 mins/week

## 2023-09-10 NOTE — Progress Notes (Signed)
Established Patient Office Visit  Subjective:  Patient ID: Theresa Matthews, female    DOB: April 03, 1956  Age: 67 y.o. MRN: [id number]  CC:  Chief Complaint  Patient presents with   Hypertension    Four month follow up     HPI Theresa Matthews is a 67 y.o. female with past medical history of anxiety and HLD who presents for f/u of her chronic medical conditions.  GAD: Currently well-controlled with Paxil. She c/o chronic insomnia, had difficulty maintaining sleep. She has started taking Paxil at bedtime again and states that she has insomnia, but is manageable.  She denies anhedonia, SI or HI.  HTN: Her BP was wnl today. She has been taking Telmisartan 80 mg QD currently. Hydrochlorothiazide was discontinued in the last visit due to dizzy spells from hypotension. She denies headache, chest pain, dyspnea or palpitations.  OSA: Did not tolerate CPAP in the past. Had sleep study, planned to get dental appliance for mild OSA.      Past Medical History:  Diagnosis Date   Anxiety    Chest pain 12/10/2020    Past Surgical History:  Procedure Laterality Date   APPENDECTOMY     NOSE SURGERY      Family History  Problem Relation Age of Onset   Hypertension Mother    Hypothyroidism Mother    Heart disease Father    Bladder Cancer Father    Brain cancer Brother     Social History   Socioeconomic History   Marital status: Married    Spouse name: Not on file   Number of children: Not on file   Years of education: Not on file   Highest education level: Bachelor's degree (e.g., BA, AB, BS)  Occupational History   Occupation: Charity fundraiser  Tobacco Use   Smoking status: Never   Smokeless tobacco: Never  Vaping Use   Vaping status: Never Used  Substance and Sexual Activity   Alcohol use: Not Currently   Drug use: Never   Sexual activity: Yes    Partners: Male  Other Topics Concern   Not on file  Social History Narrative   Is a fraternal twin. Grew up in [place].    Married,  married for over 40 years.   4 children.    Lives in [place], [place].   Eats all food groups.   Wear seatbelt.   Exercise, hike trails, ride bikes.   Social Determinants of Health   Financial Resource Strain: Low Risk  (09/10/2023)   Overall Financial Resource Strain (CARDIA)    Difficulty of Paying Living Expenses: Not hard at all  Food Insecurity: No Food Insecurity (09/10/2023)   Hunger Vital Sign    Worried About Running Out of Food in the Last Year: Never true    Ran Out of Food in the Last Year: Never true  Transportation Needs: No Transportation Needs (09/10/2023)   PRAPARE - Administrator, Civil Service (Medical): No    Lack of Transportation (Non-Medical): No  Physical Activity: Sufficiently Active (09/10/2023)   Exercise Vital Sign    Days of Exercise per Week: 4 days    Minutes of Exercise per Session: 70 min  Stress: No Stress Concern Present (04/17/2023)   [employer] of Occupational Health - Occupational Stress Questionnaire    Feeling of Stress : Only a little  Social Connections: Socially Integrated (09/10/2023)   Social Connection and Isolation Panel [NHANES]    Frequency of Communication with Friends and Family: More than  three times a week    Frequency of Social Gatherings with Friends and Family: Once a week    Attends Religious Services: More than 4 times per year    Active Member of [employer] or Organizations: Yes    Attends Engineer, structural: More than 4 times per year    Marital Status: Married  Catering manager Violence: Not At Risk (04/17/2023)   Humiliation, Afraid, Rape, and Kick questionnaire    Fear of Current or Ex-Partner: No    Emotionally Abused: No    Physically Abused: No    Sexually Abused: No    Outpatient Medications Prior to Visit  Medication Sig Dispense Refill   PARoxetine (PAXIL) 10 MG tablet TAKE 1 TABLET BY MOUTH EVERY DAY 90 tablet 1   telmisartan (MICARDIS) 80 MG tablet Take 1 tablet (80 mg total) by  mouth daily. 90 tablet 1   No facility-administered medications prior to visit.    No Known Allergies  ROS Review of Systems  Constitutional:  Negative for chills and fever.  HENT:  Negative for congestion, sinus pressure, sinus pain and sore throat.   Eyes:  Negative for pain and discharge.  Respiratory:  Negative for cough and shortness of breath.   Cardiovascular:  Negative for chest pain and palpitations.  Gastrointestinal:  Negative for abdominal pain, diarrhea, nausea and vomiting.  Endocrine: Negative for polydipsia and polyuria.  Genitourinary:  Negative for dysuria and hematuria.  Musculoskeletal:  Negative for neck pain and neck stiffness.  Skin:  Negative for rash.  Neurological:  Negative for dizziness and weakness.  Psychiatric/Behavioral:  Positive for sleep disturbance. Negative for agitation and behavioral problems.       Objective:    Physical Exam Vitals reviewed.  Constitutional:      General: She is not in acute distress.    Appearance: She is not diaphoretic.  HENT:     Head: Normocephalic and atraumatic.     Mouth/Throat:     Mouth: Mucous membranes are moist.  Eyes:     General: No scleral icterus.    Extraocular Movements: Extraocular movements intact.  Cardiovascular:     Rate and Rhythm: Normal rate and regular rhythm.     Heart sounds: Normal heart sounds. No murmur heard. Pulmonary:     Breath sounds: Normal breath sounds. No wheezing or rales.  Musculoskeletal:     Cervical back: Neck supple. No tenderness.     Right lower leg: No edema.     Left lower leg: No edema.  Skin:    General: Skin is warm.     Findings: No rash.  Neurological:     General: No focal deficit present.     Mental Status: She is alert and oriented to person, place, and time.     Sensory: No sensory deficit.     Motor: No weakness.  Psychiatric:        Mood and Affect: Mood normal.        Behavior: Behavior normal.     BP 132/77 (BP Location: Right Arm,  Patient Position: Sitting, Cuff Size: Normal)   Pulse 75   Ht 5\' 6"  (1.676 m)   Wt 145 lb 3.2 oz (65.9 kg)   SpO2 99%   BMI 23.44 kg/m  Wt Readings from Last 3 Encounters:  09/10/23 145 lb 3.2 oz (65.9 kg)  05/07/23 143 lb (64.9 kg)  04/17/23 140 lb (63.5 kg)    Lab Results  Component Value Date   TSH  1.600 09/18/2022   Lab Results  Component Value Date   WBC 5.1 09/18/2022   HGB 14.7 09/18/2022   HCT 44.5 09/18/2022   MCV 94 09/18/2022   PLT 286 09/18/2022   Lab Results  Component Value Date   NA 141 05/07/2023   K 3.7 05/07/2023   CO2 24 05/07/2023   GLUCOSE 98 05/07/2023   BUN 24 05/07/2023   CREATININE 0.90 05/07/2023   BILITOT 0.6 05/07/2023   ALKPHOS 55 05/07/2023   AST 15 05/07/2023   ALT 14 05/07/2023   PROT 6.5 05/07/2023   ALBUMIN 4.2 05/07/2023   CALCIUM 9.6 05/07/2023   ANIONGAP 14 12/11/2020   EGFR 70 05/07/2023   Lab Results  Component Value Date   CHOL 220 (H) 05/07/2023   Lab Results  Component Value Date   HDL 72 05/07/2023   Lab Results  Component Value Date   LDLCALC 126 (H) 05/07/2023   Lab Results  Component Value Date   TRIG 129 05/07/2023   Lab Results  Component Value Date   CHOLHDL 3.1 05/07/2023   Lab Results  Component Value Date   HGBA1C 5.4 12/11/2020      Assessment & Plan:   Problem List Items Addressed This Visit       Cardiovascular and Mediastinum   Essential hypertension - Primary    BP Readings from Last 1 Encounters:  09/10/23 132/77   Well-controlled with Telmisartan 80 mg QD Had dizziness likely due to hypotension - DCed hydrochlorothiazide Advised to contact if SBP remains above 140 Counseled for compliance with the medications Advised DASH diet and moderate exercise/walking, at least 150 mins/week      Relevant Medications   telmisartan (MICARDIS) 80 MG tablet   Other Relevant Orders   TSH   CMP14+EGFR   CBC with Differential/Platelet     Respiratory   OSA (obstructive sleep apnea)     Uses snore guard Used to have CPAP, did not tolerate. Referred to Pulmonology - plan to see dentist for dental appliance        Other   Anxiety    Well-controlled with Paxil      Hyperlipidemia    Checked lipid profile - LDL improved compared to prior Had discussed about statin, she prefers to do diet modification for now Advised to follow DASH diet      Relevant Medications   telmisartan (MICARDIS) 80 MG tablet   Other Relevant Orders   Lipid panel   Other Visit Diagnoses     Vitamin D deficiency       Relevant Orders   VITAMIN D 25 Hydroxy (Vit-D Deficiency, Fractures)         Meds ordered this encounter  Medications   telmisartan (MICARDIS) 80 MG tablet    Sig: Take 1 tablet (80 mg total) by mouth daily.    Dispense:  90 tablet    Refill:  1    Follow-up: Return in about 6 months (around 03/09/2024).    Anabel Halon, MD

## 2023-09-10 NOTE — Assessment & Plan Note (Signed)
Checked lipid profile - LDL improved compared to prior Had discussed about statin, she prefers to do diet modification for now Advised to follow DASH diet

## 2023-09-10 NOTE — Patient Instructions (Signed)
Please continue to take medications as prescribed.  Please continue to follow DASH diet and perform moderate exercise/walking at least 150 mins/week.  Please get fasting blood tests done before the next visit. 

## 2023-12-31 ENCOUNTER — Telehealth: Payer: Self-pay | Admitting: Adult Health

## 2023-12-31 DIAGNOSIS — G4733 Obstructive sleep apnea (adult) (pediatric): Secondary | ICD-10-CM

## 2023-12-31 NOTE — Telephone Encounter (Signed)
 Please send another referral to Dr. Myrtis Ser office for this PT. She has decided to see him for a mouth guard.

## 2023-12-31 NOTE — Telephone Encounter (Signed)
 ATC patient.  No answer.  Left detailed message per DPR, asking to verify that she needs a referral sent to Dr. Henrietta Hoover office for an oral appliance.  Mychart message sent as well.

## 2024-01-02 NOTE — Telephone Encounter (Signed)
 Called and spoke with patient, she verified that she did want a referral for an oral appliance sent to Dr. Henrietta Hoover office.  The dentist she saw in [place] is not with [facility] and was not going to be with medicare until November so she has decided to see Dr. Myrtis Ser.  I advised her that we will be glad to send the referral.  I let her know that Babette Relic is currently  not in the office, however when she returns, we can send the referral. I let her know that Dr. Henrietta Hoover office will call her to schedule her appointment.  She verbalized understanding.  Since she already had a referral sent, she just wants the provider changed, new referral sent to Dr. Myrtis Ser.  Nothing further needed.

## 2024-01-20 ENCOUNTER — Other Ambulatory Visit: Payer: Self-pay | Admitting: *Deleted

## 2024-01-20 ENCOUNTER — Telehealth: Payer: Self-pay | Admitting: Adult Health

## 2024-01-20 NOTE — Telephone Encounter (Signed)
 Please see last encounter. PT has still not rec'd the dental referral. Please call to advise on [phone]

## 2024-01-20 NOTE — Telephone Encounter (Signed)
 Referral placed for Dr. Myrtis Ser office on 12/31/23, I spoke with Kimel and she verified that they needed the sleep study with interpretation and last office note.  I verified their fax number as [phone].  She stated that once she gets the documents, she will call her and get her scheduled.  Documents faxed, received a confirmation that the fax was sent successfully.  Called patient and made her aware that I was faxing some documents that Dr. Henrietta Hoover office did not have and once they have those they will call to schedule her appointment.  She verbalized understanding.  Nothing further needed.

## 2024-01-26 ENCOUNTER — Telehealth: Payer: Self-pay | Admitting: *Deleted

## 2024-01-26 NOTE — Telephone Encounter (Signed)
 Copied from CRM [id number]. Topic: Referral - Question >> Jan 19, 2024 10:19 AM Duncan Dull wrote: Reason for CRM: patient is calling in reference to a referral that was supposed to be sent to Dr. Myrtis Ser in regards to her getting a sleep snoring device. Please call patient with further information [phone] >> Jan 26, 2024 11:07 AM Gaetano Hawthorne wrote: Patient is checking back on referral status - she spoke with Dr. Henrietta Hoover office and they mentioned that they have not received a referral. Can the referral be re-issued and faxed over to Dr. Henrietta Hoover office again - this is for the Orthodontics referral (see referral dated 01/02/2024 which appears to be closed). Please call patient when the referral has been faxed so that she can coordinate an appointment with Dr. Henrietta Hoover office. >> Jan 20, 2024 10:32 AM Chantha C wrote: Patient is following up on referral-sleep snoring device, called yesterday and has not heard back from the office. Please advise and call back today [phone].   Called Dr. Myrtis Ser office and spoke with Bjorn Loser.  Documents faxed to [phone].  She states she did not see the documents and it may be something on their end.  I advised her that I received a confirmation fax that it had been faxed successfully.  Advised I would fax it again.  Faxed again to [phone], received confirmation fax that it was successfully sent.  PCCs,  Can you please make sure the fax gets to Dr. Henrietta Hoover office.  Ringing busy.  Fax will attempt again.  This is the 2nd time I have faxed it and the patient is continuing to call.  Thank you.

## 2024-01-27 NOTE — Telephone Encounter (Signed)
 Received confirmation by Dr. Myrtis Ser office. They have the referral and there is no issues. I'll call the pt to let them know. NFN

## 2024-01-27 NOTE — Telephone Encounter (Signed)
 Referral has been faxed over to [facility] office. I'll call the Myrtis Ser in an hour to confirm that they have received the documents.

## 2024-01-28 ENCOUNTER — Telehealth: Payer: Self-pay | Admitting: Adult Health

## 2024-01-28 NOTE — Telephone Encounter (Signed)
 [email or web address]   They have been

## 2024-02-03 NOTE — Telephone Encounter (Signed)
 NFN

## 2024-02-09 DIAGNOSIS — H2513 Age-related nuclear cataract, bilateral: Secondary | ICD-10-CM | POA: Diagnosis not present

## 2024-02-09 DIAGNOSIS — H40013 Open angle with borderline findings, low risk, bilateral: Secondary | ICD-10-CM | POA: Diagnosis not present

## 2024-02-09 DIAGNOSIS — H524 Presbyopia: Secondary | ICD-10-CM | POA: Diagnosis not present

## 2024-03-03 DIAGNOSIS — I10 Essential (primary) hypertension: Secondary | ICD-10-CM | POA: Diagnosis not present

## 2024-03-03 DIAGNOSIS — E782 Mixed hyperlipidemia: Secondary | ICD-10-CM | POA: Diagnosis not present

## 2024-03-03 DIAGNOSIS — E559 Vitamin D deficiency, unspecified: Secondary | ICD-10-CM | POA: Diagnosis not present

## 2024-03-04 LAB — CBC WITH DIFFERENTIAL/PLATELET
Basophils Absolute: 0 10*3/uL (ref 0.0–0.2)
Basos: 1 %
EOS (ABSOLUTE): 0 10*3/uL (ref 0.0–0.4)
Eos: 1 %
Hematocrit: 44.3 % (ref 34.0–46.6)
Hemoglobin: 14.4 g/dL (ref 11.1–15.9)
Immature Grans (Abs): 0 10*3/uL (ref 0.0–0.1)
Immature Granulocytes: 0 %
Lymphocytes Absolute: 1.1 10*3/uL (ref 0.7–3.1)
Lymphs: 24 %
MCH: 31.6 pg (ref 26.6–33.0)
MCHC: 32.5 g/dL (ref 31.5–35.7)
MCV: 97 fL (ref 79–97)
Monocytes Absolute: 0.5 10*3/uL (ref 0.1–0.9)
Monocytes: 10 %
Neutrophils Absolute: 2.9 10*3/uL (ref 1.4–7.0)
Neutrophils: 64 %
Platelets: 291 10*3/uL (ref 150–450)
RBC: 4.55 x10E6/uL (ref 3.77–5.28)
RDW: 12.3 % (ref 11.7–15.4)
WBC: 4.5 10*3/uL (ref 3.4–10.8)

## 2024-03-04 LAB — LIPID PANEL
Chol/HDL Ratio: 2.8 ratio (ref 0.0–4.4)
Cholesterol, Total: 216 mg/dL — ABNORMAL HIGH (ref 100–199)
HDL: 78 mg/dL (ref >39)
LDL Chol Calc (NIH): 128 mg/dL — ABNORMAL HIGH (ref 0–99)
Triglycerides: 56 mg/dL (ref 0–149)
VLDL Cholesterol Cal: 10 mg/dL (ref 5–40)

## 2024-03-04 LAB — CMP14+EGFR
ALT: 18 IU/L (ref 0–32)
AST: 20 IU/L (ref 0–40)
Albumin: 4.4 g/dL (ref 3.9–4.9)
Alkaline Phosphatase: 58 IU/L (ref 44–121)
BUN/Creatinine Ratio: 21 (ref 12–28)
BUN: 17 mg/dL (ref 8–27)
Bilirubin Total: 0.6 mg/dL (ref 0.0–1.2)
CO2: 24 mmol/L (ref 20–29)
Calcium: 9 mg/dL (ref 8.7–10.3)
Chloride: 104 mmol/L (ref 96–106)
Creatinine, Ser: 0.81 mg/dL (ref 0.57–1.00)
Globulin, Total: 2.2 g/dL (ref 1.5–4.5)
Glucose: 80 mg/dL (ref 70–99)
Potassium: 4.2 mmol/L (ref 3.5–5.2)
Sodium: 142 mmol/L (ref 134–144)
Total Protein: 6.6 g/dL (ref 6.0–8.5)
eGFR: 79 mL/min/{1.73_m2} (ref >59)

## 2024-03-04 LAB — VITAMIN D 25 HYDROXY (VIT D DEFICIENCY, FRACTURES): Vit D, 25-Hydroxy: 54.8 ng/mL (ref 30.0–100.0)

## 2024-03-04 LAB — TSH: TSH: 2.14 u[IU]/mL (ref 0.450–4.500)

## 2024-03-09 ENCOUNTER — Ambulatory Visit: Payer: Medicare Other | Admitting: Internal Medicine

## 2024-03-13 ENCOUNTER — Telehealth

## 2024-03-16 ENCOUNTER — Telehealth: Payer: Self-pay | Admitting: Internal Medicine

## 2024-03-16 ENCOUNTER — Ambulatory Visit (INDEPENDENT_AMBULATORY_CARE_PROVIDER_SITE_OTHER)

## 2024-03-16 VITALS — BP 135/81 | HR 80 | Ht 66.0 in | Wt 140.1 lb

## 2024-03-16 DIAGNOSIS — F419 Anxiety disorder, unspecified: Secondary | ICD-10-CM | POA: Diagnosis not present

## 2024-03-16 DIAGNOSIS — I10 Essential (primary) hypertension: Secondary | ICD-10-CM

## 2024-03-16 MED ORDER — TELMISARTAN 80 MG PO TABS: 80.0000 mg | ORAL_TABLET | Freq: Every day | ORAL | 2 refills | Status: DC

## 2024-03-16 MED ORDER — PAROXETINE HCL 10 MG PO TABS: 10.0000 mg | ORAL_TABLET | Freq: Every day | ORAL | 2 refills | Status: DC

## 2024-03-16 NOTE — Progress Notes (Signed)
 Established Patient Office Visit  Subjective   Patient ID: Theresa Matthews, female    DOB: [birth year]  Age: 68 y.o. MRN: [id number]  Chief Complaint  Patient presents with   Medical Management of Chronic Issues    6 month fu    HPI  Theresa Matthews is a 68 y.o. female with past medical history of anxiety and HLD who presents for f/u of her chronic medical conditions.   GAD: Currently well-controlled with Paxil . She c/o chronic insomnia, had difficulty maintaining sleep. She has started taking Paxil  at bedtime again and states that she has insomnia, but is manageable.  She denies anhedonia, SI or HI.   HTN: Her BP was wnl today. She has been taking Telmisartan  80 mg QD currently. Hydrochlorothiazide was discontinued in the last visit due to dizzy spells from hypotension. She denies headache, chest pain, dyspnea or palpitations.   Patient Active Problem List   Diagnosis Date Noted   Essential hypertension 07/31/2022   Primary insomnia 07/31/2022   Hyperlipidemia 12/10/2020   OSA (obstructive sleep apnea) 03/02/2018   Anxiety 03/02/2018   Past Medical History:  Diagnosis Date   Anxiety    Chest pain 12/10/2020      ROS    Objective:     BP 135/81   Pulse 80   Ht 5\' 6"  (1.676 m)   Wt 140 lb 1.9 oz (63.6 kg)   SpO2 98%   BMI 22.62 kg/m  BP Readings from Last 3 Encounters:  03/16/24 135/81  09/10/23 132/77  05/07/23 98/62   Wt Readings from Last 3 Encounters:  03/16/24 140 lb 1.9 oz (63.6 kg)  09/10/23 145 lb 3.2 oz (65.9 kg)  05/07/23 143 lb (64.9 kg)      Physical Exam Vitals and nursing note reviewed.  Constitutional:      Appearance: Normal appearance.  HENT:     Head: Normocephalic.     Right Ear: Tympanic membrane, ear canal and external ear normal.     Left Ear: Tympanic membrane, ear canal and external ear normal.     Nose: Nose normal.     Mouth/Throat:     Mouth: Mucous membranes are moist.     Pharynx: Oropharynx is clear.  Cardiovascular:      Rate and Rhythm: Normal rate and regular rhythm.  Pulmonary:     Effort: Pulmonary effort is normal.     Breath sounds: Normal breath sounds.  Musculoskeletal:     Cervical back: Normal range of motion and neck supple.  Skin:    General: Skin is warm and dry.  Neurological:     Mental Status: She is alert and oriented to person, place, and time.  Psychiatric:        Mood and Affect: Mood normal.        Thought Content: Thought content normal.      No results found for any visits on 03/16/24.  Last CBC Lab Results  Component Value Date   WBC 4.5 03/03/2024   HGB 14.4 03/03/2024   HCT 44.3 03/03/2024   MCV 97 03/03/2024   MCH 31.6 03/03/2024   RDW 12.3 03/03/2024   PLT 291 03/03/2024   Last metabolic panel Lab Results  Component Value Date   GLUCOSE 80 03/03/2024   NA 142 03/03/2024   K 4.2 03/03/2024   CL 104 03/03/2024   CO2 24 03/03/2024   BUN 17 03/03/2024   CREATININE 0.81 03/03/2024   EGFR 79 03/03/2024   CALCIUM  9.0 03/03/2024  PHOS 4.1 12/11/2020   PROT 6.6 03/03/2024   ALBUMIN 4.4 03/03/2024   LABGLOB 2.2 03/03/2024   AGRATIO 2.1 09/18/2022   BILITOT 0.6 03/03/2024   ALKPHOS 58 03/03/2024   AST 20 03/03/2024   ALT 18 03/03/2024   ANIONGAP 14 12/11/2020   Last lipids Lab Results  Component Value Date   CHOL 216 (H) 03/03/2024   HDL 78 03/03/2024   LDLCALC 128 (H) 03/03/2024   TRIG 56 03/03/2024   CHOLHDL 2.8 03/03/2024   Last hemoglobin A1c Lab Results  Component Value Date   HGBA1C 5.4 12/11/2020   Last thyroid  functions Lab Results  Component Value Date   TSH 2.140 03/03/2024   Last vitamin D  Lab Results  Component Value Date   VD25OH 54.8 03/03/2024     The 10-year ASCVD risk score (Arnett DK, et al., 2019) is: 11.9%    Assessment & Plan:   Problem List Items Addressed This Visit       Cardiovascular and Mediastinum   Essential hypertension - Primary   Well-controlled with Telmisartan  80 mg QD Advised to contact if  SBP remains above 140 Counseled for compliance with the medications Advised DASH diet and moderate exercise/walking, at least 150 mins/week      Relevant Medications   telmisartan  (MICARDIS ) 80 MG tablet     Other   Anxiety   Well-controlled with Paxil .  She has been well controlled on this for years.       Relevant Medications   PARoxetine  (PAXIL ) 10 MG tablet    No follow-ups on file.    Alison Irvine, FNP

## 2024-03-16 NOTE — Assessment & Plan Note (Addendum)
 Well-controlled with Paxil .  She has been well controlled on this for years.  Recommend f/u in 6 months.

## 2024-03-16 NOTE — Assessment & Plan Note (Signed)
 Well-controlled with Telmisartan  80 mg QD Advised to contact if SBP remains above 140 Counseled for compliance with the medications Advised DASH diet and moderate exercise/walking, at least 150 mins/week

## 2024-03-16 NOTE — Telephone Encounter (Signed)
**Note De-identified  [place] Obfuscation** Please advise 

## 2024-03-16 NOTE — Telephone Encounter (Signed)
 Patient checking out from appt with Rice Chamorro- she is wanting to switch providers over to Rice Chamorro says she enjoyed her visit with her today. Please advise on scheduling Thank you

## 2024-03-24 NOTE — Telephone Encounter (Signed)
 LVM to inform pt and let her know if she is needing an appt that our call center will gladly get her scheduled.

## 2024-04-19 ENCOUNTER — Ambulatory Visit: Payer: Medicare Other

## 2024-04-19 VITALS — Ht 66.0 in | Wt 137.0 lb

## 2024-04-19 DIAGNOSIS — Z0001 Encounter for general adult medical examination with abnormal findings: Secondary | ICD-10-CM

## 2024-04-19 DIAGNOSIS — Z780 Asymptomatic menopausal state: Secondary | ICD-10-CM

## 2024-04-19 NOTE — Progress Notes (Signed)
 Subjective:   Theresa Matthews is a 68 y.o. who presents for a Medicare Wellness preventive visit.  As a reminder, Annual Wellness Visits don't include a physical exam, and some assessments may be limited, especially if this visit is performed virtually. We may recommend an in-person follow-up visit with your provider if needed.  Visit Complete: Virtual I connected with  Inocente Mace on 04/19/24 by a audio enabled telemedicine application and verified that I am speaking with the correct person using two identifiers.  Patient Location: Home  Provider Location: Home Office  I discussed the limitations of evaluation and management by telemedicine. The patient expressed understanding and agreed to proceed.  Vital Signs: Because this visit was a virtual/telehealth visit, some criteria may be missing or patient reported. Any vitals not documented were not able to be obtained and vitals that have been documented are patient reported.  VideoDeclined- This patient declined Librarian, academic. Therefore the visit was completed with audio only.  Persons Participating in Visit: Patient.  AWV Questionnaire: No: Patient Medicare AWV questionnaire was not completed prior to this visit.  Cardiac Risk Factors include: advanced age (>60men, >60 women);hypertension     Objective:    Today's Vitals   04/19/24 1336  Weight: 137 lb (62.1 kg)  Height: 5' 6 (1.676 m)   Body mass index is 22.11 kg/m.     04/19/2024    1:55 PM 04/17/2023    3:28 PM 12/11/2020    1:51 AM  Advanced Directives  Does Patient Have a Medical Advance Directive? No No No  Would patient like information on creating a medical advance directive? Yes (MAU/Ambulatory/Procedural Areas - Information given) No - Patient declined No - Patient declined    Current Medications (verified) Outpatient Encounter Medications as of 04/19/2024  Medication Sig   PARoxetine  (PAXIL ) 10 MG tablet Take 1 tablet (10  mg total) by mouth daily.   telmisartan  (MICARDIS ) 80 MG tablet Take 1 tablet (80 mg total) by mouth daily.   No facility-administered encounter medications on file as of 04/19/2024.    Allergies (verified) Patient has no known allergies.   History: Past Medical History:  Diagnosis Date   Anxiety    Chest pain 12/10/2020   Past Surgical History:  Procedure Laterality Date   APPENDECTOMY     NOSE SURGERY     Family History  Problem Relation Age of Onset   Hypertension Mother    Hypothyroidism Mother    Heart disease Father    Bladder Cancer Father    Brain cancer Brother    Social History   Socioeconomic History   Marital status: Married    Spouse name: Not on file   Number of children: Not on file   Years of education: Not on file   Highest education level: Bachelor's degree (e.g., BA, AB, BS)  Occupational History   Occupation: Charity fundraiser  Tobacco Use   Smoking status: Never   Smokeless tobacco: Never  Vaping Use   Vaping status: Never Used  Substance and Sexual Activity   Alcohol use: Not Currently   Drug use: Never   Sexual activity: Yes    Partners: Male  Other Topics Concern   Not on file  Social History Narrative   Is a fraternal twin. Grew up in [place] .    Married, married for over 40 years.   4 children.    Lives in [place], [place].   Eats all food groups.   Wear seatbelt.   Exercise,  hike trails, ride bikes.   Social Drivers of Corporate investment banker Strain: Low Risk  (04/19/2024)   Overall Financial Resource Strain (CARDIA)    Difficulty of Paying Living Expenses: Not hard at all  Food Insecurity: No Food Insecurity (04/19/2024)   Hunger Vital Sign    Worried About Running Out of Food in the Last Year: Never true    Ran Out of Food in the Last Year: Never true  Transportation Needs: No Transportation Needs (04/19/2024)   PRAPARE - Administrator, Civil Service (Medical): No    Lack of Transportation (Non-Medical): No  Physical  Activity: Sufficiently Active (04/19/2024)   Exercise Vital Sign    Days of Exercise per Week: 7 days    Minutes of Exercise per Session: 30 min  Stress: No Stress Concern Present (04/19/2024)   [employer] of Occupational Health - Occupational Stress Questionnaire    Feeling of Stress: Only a little  Social Connections: Moderately Integrated (04/19/2024)   Social Connection and Isolation Panel    Frequency of Communication with Friends and Family: More than three times a week    Frequency of Social Gatherings with Friends and Family: Once a week    Attends Religious Services: More than 4 times per year    Active Member of [employer] or Organizations: No    Attends Banker Meetings: Never    Marital Status: Married    Tobacco Counseling Counseling given: Yes    Clinical Intake:  Pre-visit preparation completed: Yes  Pain : No/denies pain     BMI - recorded: 22.11 Nutritional Status: BMI of 19-24  Normal Nutritional Risks: None Diabetes: No  Lab Results  Component Value Date   HGBA1C 5.4 12/11/2020     How often do you need to have someone help you when you read instructions, pamphlets, or other written materials from your doctor or pharmacy?: 1 - Never  Interpreter Needed?: No  Information entered by :: Stefano ORN CMA   Activities of Daily Living     04/19/2024    1:39 PM  In your present state of health, do you have any difficulty performing the following activities:  Hearing? 0  Vision? 0  Difficulty concentrating or making decisions? 0  Walking or climbing stairs? 0  Dressing or bathing? 0  Doing errands, shopping? 0  Preparing Food and eating ? N  Using the Toilet? N  In the past six months, have you accidently leaked urine? N  Do you have problems with loss of bowel control? N  Managing your Medications? N  Managing your Finances? N  Housekeeping or managing your Housekeeping? N    Patient Care Team: Tobie Suzzane POUR, MD as PCP - General  (Internal Medicine) Steva, [facility] as Consulting Physician (Optometry)  I have updated your Care Teams any recent Medical Services you may have received from other providers in the past year.     Assessment:   This is a routine wellness examination for [place].  Hearing/Vision screen Hearing Screening - Comments:: Patient denies any hearing difficulties.   Vision Screening - Comments:: Wears rx glasses - up to date with routine eye exams with  [facility]    Goals Addressed             This Visit's Progress    Patient Stated       Retire by September or October.        Depression  Screen     04/19/2024    1:40 PM 03/16/2024    9:16 AM 09/10/2023    8:43 AM 05/07/2023    2:51 PM 04/17/2023    3:31 PM 07/31/2022    4:07 PM 01/25/2021    3:20 PM  PHQ 2/9 Scores  PHQ - 2 Score 0 0 0 0 0 0 0  PHQ- 9 Score 2 2 1         Fall Risk     04/19/2024    1:38 PM 03/16/2024    9:16 AM 09/10/2023    8:43 AM 05/07/2023    2:51 PM 04/17/2023    3:31 PM  Fall Risk   Falls in the past year? 0 0 0 0 0  Number falls in past yr: 0 0 0 0 0  Injury with Fall? 0 0 0 0 0  Risk for fall due to : No Fall Risks No Fall Risks No Fall Risks  No Fall Risks  Follow up Falls evaluation completed;Education provided;Falls prevention discussed Falls evaluation completed Falls evaluation completed  Falls prevention discussed    MEDICARE RISK AT HOME:  Medicare Risk at Home Any stairs in or around the home?: Yes If so, are there any without handrails?: No Home free of loose throw rugs in walkways, pet beds, electrical cords, etc?: Yes Adequate lighting in your home to reduce risk of falls?: Yes Life alert?: No Use of a cane, walker or w/c?: No Grab bars in the bathroom?: No Shower chair or bench in shower?: No Elevated toilet seat or a handicapped toilet?: No  TIMED UP AND GO:  Was the test performed?  No  Cognitive Function: 6CIT completed         04/19/2024    1:39 PM 04/17/2023    3:30 PM  6CIT Screen  What Year? 0 points 0 points  What month? 0 points 0 points  What time? 0 points 0 points  Count back from 20 0 points 0 points  Months in reverse 0 points 0 points  Repeat phrase 0 points 0 points  Total Score 0 points 0 points    Immunizations Immunization History  Administered Date(s) Administered   Fluad Quad(high Dose 65+) 08/19/2022, 08/27/2023   Hepatitis B, PED/ADOLESCENT 05/16/2005   Moderna Sars-Covid-2 Vaccination 09/21/2019, 10/22/2019   PNEUMOCOCCAL CONJUGATE-20 05/07/2023   Tdap 05/16/2005   Zoster Recombinant(Shingrix) 03/15/2021, 05/31/2021    Screening Tests Health Maintenance  Topic Date Due   Hepatitis B Vaccines (2 of 3 - 19+ 3-dose series) 06/13/2005   DTaP/Tdap/Td (2 - Td or Tdap) 05/17/2015   DEXA SCAN  01/19/2018   MAMMOGRAM  02/28/2023   COVID-19 Vaccine (3 - 2024-25 season) 06/22/2023   INFLUENZA VACCINE  05/21/2024   Medicare Annual Wellness (AWV)  04/19/2025   Colonoscopy  10/21/2025   Pneumococcal Vaccine: 50+ Years  Completed   Hepatitis C Screening  Completed   Zoster Vaccines- Shingrix  Completed   HPV VACCINES  Aged Out   Meningococcal B Vaccine  Aged Out    Health Maintenance  Health Maintenance Due  Topic Date Due   Hepatitis B Vaccines (2 of 3 - 19+ 3-dose series) 06/13/2005   DTaP/Tdap/Td (2 - Td or Tdap) 05/17/2015   DEXA SCAN  01/19/2018   MAMMOGRAM  02/28/2023   COVID-19 Vaccine (3 - 2024-25 season) 06/22/2023   Health Maintenance Items Addressed: Mammogram ordered, DEXA ordered  Additional Screening:  Vision Screening: Recommended annual ophthalmology exams for early detection of glaucoma and  other disorders of the eye. Would you like a referral to an eye doctor? No    Dental Screening: Recommended annual dental exams for proper oral hygiene  Community Resource Referral / Chronic Care Management: CRR required this visit?  No   CCM required this visit?   No   Plan:    I have personally reviewed and noted the following in the patient's chart:   Medical and social history Use of alcohol, tobacco or illicit drugs  Current medications and supplements including opioid prescriptions. Patient is not currently taking opioid prescriptions. Functional ability and status Nutritional status Physical activity Advanced directives List of other physicians Hospitalizations, surgeries, and ER visits in previous 12 months Vitals Screenings to include cognitive, depression, and falls Referrals and appointments  In addition, I have reviewed and discussed with patient certain preventive protocols, quality metrics, and best practice recommendations. A written personalized care plan for preventive services as well as general preventive health recommendations were provided to patient.   Shourya Macpherson, CMA   04/19/2024   After Visit Summary: (MyChart) Due to this being a telephonic visit, the after visit summary with patients personalized plan was offered to patient via MyChart   Notes: Nothing significant to report at this time.

## 2024-04-19 NOTE — Patient Instructions (Signed)
 Ms. Rosado , Thank you for taking time out of your busy schedule to complete your Annual Wellness Visit with me. I enjoyed our conversation and look forward to speaking with you again next year. I, as well as your care team,  appreciate your ongoing commitment to your health goals. Please review the following plan we discussed and let me know if I can assist you in the future.  Your Game plan/ To Do List    Referrals: If you haven't heard from the office you've been referred to, please reach out to them at the phone provided.  [facility] does not do bone density screenings on site. They send their patients to [facility] and that info is below Osteoporosis Screening [facility] (CALL TO SCHEDULE YOUR SCREENING) [address] [place]  [address] Phone [phone]   GYN Referral: [facility] (They do mammograms on site. Call to schedule your appointment) [address]  [place] [address] [facility] [phone]  Follow up Visits: Next Medicare AWV with our clinical staff: April 20, 2025 at 9:20 am video visit   Have you seen your provider in the last 6 months (3 months if uncontrolled diabetes)? Yes Next Office Visit with your provider: October 19, 2024 at 1:20 pm in office  Clinician Recommendations:  Aim for 30 minutes of exercise or brisk walking, 6-8 glasses of water, and 5 servings of fruits and vegetables each day.       This is a list of the screening recommended for you and due dates:  Health Maintenance  Topic Date Due   Hepatitis B Vaccine (2 of 3 - 19+ 3-dose series) 06/13/2005   DTaP/Tdap/Td vaccine (2 - Td or Tdap) 05/17/2015   DEXA scan (bone density measurement)  01/19/2018   Mammogram  02/28/2023   COVID-19 Vaccine (3 - 2024-25 season) 06/22/2023   Flu Shot  05/21/2024   Medicare Annual Wellness Visit  04/19/2025   Colon Cancer Screening  10/21/2025   Pneumococcal Vaccine for age over 2  Completed   Hepatitis C  Screening  Completed   Zoster (Shingles) Vaccine  Completed   HPV Vaccine  Aged Out   Meningitis B Vaccine  Aged Out    Advanced directives: (Provided) Advance directive discussed with you today. I have provided a copy for you to complete at home and have notarized. Once this is complete, please bring a copy in to our office so we can scan it into your chart.  Advance Care Planning is important because it:  [x]  Makes sure you receive the medical care that is consistent with your values, goals, and preferences  [x]  It provides guidance to your family and loved ones and reduces their decisional burden about whether or not they are making the right decisions based on your wishes.  Follow the link provided in your after visit summary or read over the paperwork we have mailed to you to help you started getting your Advance Directives in place. If you need assistance in completing these, please reach out to us  so that we can help you! Understanding Your Risk for Falls Millions of people have serious injuries from falls each year. It is important to understand your risk of falling. Talk with your health care provider about your risk and what you can do to lower it. If you do have a serious fall, make sure to tell your provider. Falling once raises your risk of falling again. How can falls affect me? Serious injuries  from falls are common. These include: Broken bones, such as hip fractures. Head injuries, such as traumatic brain injuries (TBI) or concussions. A fear of falling can cause you to avoid activities and stay at home. This can make your muscles weaker and raise your risk for a fall. What can increase my risk? There are a number of risk factors that increase your risk for falling. The more risk factors you have, the higher your risk of falling. Serious injuries from a fall happen most often to people who are older than 70 years old. Teenagers and young adults ages 75-29 are also at higher  risk. Common risk factors include: Weakness in the lower body. Being generally weak or confused due to long-term (chronic) illness. Dizziness or balance problems. Poor vision. Medicines that cause dizziness or drowsiness. These may include: Medicines for your blood pressure, heart, anxiety, insomnia, or swelling (edema). Pain medicines. Muscle relaxants. Other risk factors include: Drinking alcohol. Having had a fall in the past. Having foot pain or wearing improper footwear. Working at a dangerous job. Having any of the following in your home: Tripping hazards, such as floor clutter or loose rugs. Poor lighting. Pets. Having dementia or memory loss. What actions can I take to lower my risk of falling?     Physical activity Stay physically fit. Do strength and balance exercises. Consider taking a regular class to build strength and balance. Yoga and tai chi are good options. Vision Have your eyes checked every year and your prescription for glasses or contacts updated as needed. Shoes and walking aids Wear non-skid shoes. Wear shoes that have rubber soles and low heels. Do not wear high heels. Do not walk around the house in socks or slippers. Use a cane or walker as told by your provider. Home safety Attach secure railings on both sides of your stairs. Install grab bars for your bathtub, shower, and toilet. Use a non-skid mat in your bathtub or shower. Attach bath mats securely with double-sided, non-slip rug tape. Use good lighting in all rooms. Keep a flashlight near your bed. Make sure there is a clear path from your bed to the bathroom. Use night-lights. Do not use throw rugs. Make sure all carpeting is taped or tacked down securely. Remove all clutter from walkways and stairways, including extension cords. Repair uneven or broken steps and floors. Avoid walking on icy or slippery surfaces. Walk on the grass instead of on icy or slick sidewalks. Use ice melter to get  rid of ice on walkways in the winter. Use a cordless phone. Questions to ask your health care provider Can you help me check my risk for a fall? Do any of my medicines make me more likely to fall? Should I take a vitamin D  supplement? What exercises can I do to improve my strength and balance? Should I make an appointment to have my vision checked? Do I need a bone density test to check for weak bones (osteoporosis)? Would it help to use a cane or a walker? Where to find more information Centers for Disease Control and Prevention, STEADI: TonerPromos.no Community-Based Fall Prevention Programs: TonerPromos.no [employer] on Aging: BaseRingTones.pl Contact a health care provider if: You fall at home. You are afraid of falling at home. You feel weak, drowsy, or dizzy. This information is not intended to replace advice given to you by your health care provider. Make sure you discuss any questions you have with your health care provider. Document Revised: 06/10/2022 Document Reviewed: 06/10/2022  Elsevier Patient Education  2024 [employer].

## 2024-08-24 ENCOUNTER — Other Ambulatory Visit: Payer: Self-pay | Admitting: Internal Medicine

## 2024-08-24 DIAGNOSIS — I10 Essential (primary) hypertension: Secondary | ICD-10-CM

## 2024-08-24 DIAGNOSIS — F419 Anxiety disorder, unspecified: Secondary | ICD-10-CM

## 2024-08-24 MED ORDER — PAROXETINE HCL 10 MG PO TABS: 10.0000 mg | ORAL_TABLET | Freq: Every day | ORAL | 3 refills | Status: AC

## 2024-08-24 MED ORDER — TELMISARTAN 80 MG PO TABS: 80.0000 mg | ORAL_TABLET | Freq: Every day | ORAL | 3 refills | Status: AC

## 2024-09-20 DIAGNOSIS — Z01419 Encounter for gynecological examination (general) (routine) without abnormal findings: Secondary | ICD-10-CM | POA: Diagnosis not present

## 2024-09-20 DIAGNOSIS — Z1231 Encounter for screening mammogram for malignant neoplasm of breast: Secondary | ICD-10-CM | POA: Diagnosis not present

## 2024-10-19 ENCOUNTER — Ambulatory Visit: Admitting: Internal Medicine

## 2025-04-20 ENCOUNTER — Ambulatory Visit
